# Patient Record
Sex: Female | Born: 1950
Health system: Southern US, Community
[De-identification: ages and names within clinical notes are randomized; demographics above are authoritative.]

## PROBLEM LIST (undated history)

## (undated) DIAGNOSIS — K5790 Diverticulosis of intestine, part unspecified, without perforation or abscess without bleeding: Secondary | ICD-10-CM

## (undated) DIAGNOSIS — E785 Hyperlipidemia, unspecified: Secondary | ICD-10-CM

## (undated) DIAGNOSIS — M8580 Other specified disorders of bone density and structure, unspecified site: Secondary | ICD-10-CM

## (undated) DIAGNOSIS — K209 Esophagitis, unspecified without bleeding: Secondary | ICD-10-CM

## (undated) HISTORY — PX: BREAST SURGERY: SHX581

## (undated) HISTORY — PX: HAMMER TOE SURGERY: SHX385

## (undated) HISTORY — DX: Diverticulosis of intestine, part unspecified, without perforation or abscess without bleeding: K57.90

## (undated) HISTORY — DX: Other specified disorders of bone density and structure, unspecified site: M85.80

## (undated) HISTORY — PX: TENNIS ELBOW RELEASE/NIRSCHEL PROCEDURE: SHX6651

## (undated) HISTORY — PX: OTHER SURGICAL HISTORY: SHX169

## (undated) HISTORY — PX: ABDOMINAL HYSTERECTOMY: SHX81

## (undated) HISTORY — DX: Esophagitis, unspecified without bleeding: K20.90

## (undated) HISTORY — DX: Hyperlipidemia, unspecified: E78.5

## (undated) HISTORY — PX: BUNIONECTOMY: SHX129

## (undated) HISTORY — PX: FOOT SURGERY: SHX648

---

## 1994-10-16 HISTORY — PX: BREAST BIOPSY: SHX20

## 1998-03-08 ENCOUNTER — Encounter: Admission: RE | Admit: 1998-03-08 | Discharge: 1998-03-08 | Payer: Self-pay | Admitting: Family Medicine

## 1998-09-22 ENCOUNTER — Encounter: Admission: RE | Admit: 1998-09-22 | Discharge: 1998-09-22 | Payer: Self-pay | Admitting: Family Medicine

## 1998-10-13 ENCOUNTER — Encounter: Admission: RE | Admit: 1998-10-13 | Discharge: 1998-10-13 | Payer: Self-pay | Admitting: Sports Medicine

## 2000-04-12 ENCOUNTER — Ambulatory Visit (HOSPITAL_BASED_OUTPATIENT_CLINIC_OR_DEPARTMENT_OTHER): Admission: RE | Admit: 2000-04-12 | Discharge: 2000-04-12 | Payer: Self-pay | Admitting: Podiatry

## 2001-09-16 ENCOUNTER — Other Ambulatory Visit: Admission: RE | Admit: 2001-09-16 | Discharge: 2001-09-16 | Payer: Self-pay | Admitting: *Deleted

## 2002-06-06 ENCOUNTER — Encounter: Payer: Self-pay | Admitting: Internal Medicine

## 2002-06-06 ENCOUNTER — Encounter (INDEPENDENT_AMBULATORY_CARE_PROVIDER_SITE_OTHER): Payer: Self-pay | Admitting: Interventional Cardiology

## 2002-06-06 ENCOUNTER — Inpatient Hospital Stay (HOSPITAL_COMMUNITY): Admission: AD | Admit: 2002-06-06 | Discharge: 2002-06-07 | Payer: Self-pay | Admitting: Internal Medicine

## 2002-10-20 ENCOUNTER — Other Ambulatory Visit: Admission: RE | Admit: 2002-10-20 | Discharge: 2002-10-20 | Payer: Self-pay | Admitting: *Deleted

## 2003-12-07 ENCOUNTER — Other Ambulatory Visit: Admission: RE | Admit: 2003-12-07 | Discharge: 2003-12-07 | Payer: Self-pay | Admitting: *Deleted

## 2004-12-19 ENCOUNTER — Other Ambulatory Visit: Admission: RE | Admit: 2004-12-19 | Discharge: 2004-12-19 | Payer: Self-pay | Admitting: *Deleted

## 2005-06-06 ENCOUNTER — Encounter (INDEPENDENT_AMBULATORY_CARE_PROVIDER_SITE_OTHER): Payer: Self-pay | Admitting: *Deleted

## 2005-06-06 ENCOUNTER — Ambulatory Visit (HOSPITAL_COMMUNITY): Admission: RE | Admit: 2005-06-06 | Discharge: 2005-06-06 | Payer: Self-pay | Admitting: Podiatry

## 2005-06-06 ENCOUNTER — Ambulatory Visit (HOSPITAL_BASED_OUTPATIENT_CLINIC_OR_DEPARTMENT_OTHER): Admission: RE | Admit: 2005-06-06 | Discharge: 2005-06-06 | Payer: Self-pay | Admitting: Podiatry

## 2005-08-31 ENCOUNTER — Ambulatory Visit (HOSPITAL_COMMUNITY): Admission: RE | Admit: 2005-08-31 | Discharge: 2005-08-31 | Payer: Self-pay | Admitting: Gastroenterology

## 2006-01-09 ENCOUNTER — Other Ambulatory Visit: Admission: RE | Admit: 2006-01-09 | Discharge: 2006-01-09 | Payer: Self-pay | Admitting: *Deleted

## 2007-02-05 ENCOUNTER — Other Ambulatory Visit: Admission: RE | Admit: 2007-02-05 | Discharge: 2007-02-05 | Payer: Self-pay | Admitting: *Deleted

## 2007-10-25 ENCOUNTER — Ambulatory Visit (HOSPITAL_BASED_OUTPATIENT_CLINIC_OR_DEPARTMENT_OTHER): Admission: RE | Admit: 2007-10-25 | Discharge: 2007-10-25 | Payer: Self-pay | Admitting: Orthopedic Surgery

## 2007-12-31 ENCOUNTER — Encounter: Admission: RE | Admit: 2007-12-31 | Discharge: 2008-02-21 | Payer: Self-pay | Admitting: Orthopedic Surgery

## 2009-08-27 ENCOUNTER — Encounter (INDEPENDENT_AMBULATORY_CARE_PROVIDER_SITE_OTHER): Payer: Self-pay | Admitting: General Surgery

## 2009-08-27 ENCOUNTER — Ambulatory Visit (HOSPITAL_BASED_OUTPATIENT_CLINIC_OR_DEPARTMENT_OTHER): Admission: RE | Admit: 2009-08-27 | Discharge: 2009-08-27 | Payer: Self-pay | Admitting: General Surgery

## 2011-01-18 LAB — POCT HEMOGLOBIN-HEMACUE: Hemoglobin: 13.7 g/dL (ref 12.0–15.0)

## 2011-02-28 NOTE — Op Note (Signed)
[name], [name]             ACCOUNT NO.:  [id number]   MEDICAL RECORD NO.:  [id number]          PATIENT TYPE:  AMB   LOCATION:  DSC                          FACILITY:  [facility]   PHYSICIAN:  John L. Rendall, M.D.  DATE OF BIRTH:  20-Apr-1951   DATE OF PROCEDURE:  10/25/2007  DATE OF DISCHARGE:                               OPERATIVE REPORT   PREOPERATIVE DIAGNOSIS:  Painful metacarpophalangeal prosthesis left  large toe.   SURGICAL PROCEDURES:  Removal of titanium implants large toe proximal  phalanx, debridement of bone spurs of an essential hallux rigidus and  proceed with a Keller bunionectomy.   POSTOPERATIVE DIAGNOSIS:  Failed metacarpophalangeal arthroplasty.   SURGEON:  John L. Rendall, M.D.   ASSISTANT:  Legrand Pitts. Duffy, P.A.   PROCEDURE:  Under general anesthesia, the left foot prepared with  DuraPrep and this was under heavy sedation and ankle block anesthesia.  The left foot and ankle was prepared with DuraPrep and draped as a  sterile field.  The previous two-inch incision medial to the EHL is  reopened, dense scar tissue is encountered and dissection is done with  loupe magnification looking for and trying to be careful of preserving  any cutaneous nerves in the area, none were seen or encountered.  Dissection is carried down to bone across the MP joint.  The titanium  implant was encountered.  By subperiosteal dissection, the distal 2 cm  of the metatarsal head and proximal 1 cm of the proximal phalanx is  exposed.  The joint is dislocated with the distal coming dorsally and  the implant is dislodged.  It was found to not be ingrown in any way and  came out relatively easily.  The proximal end of the phalanx was then  further debrided the distal portion of the toe is then dislocated to the  plantar surface.  The metatarsal head was debrided.  It had extensive  fibrous tissue and a bony osteophyte on the plantar surface.  This was  taken off with osteotome and  rongeur and once it was gone, two 0.62 K-  wires were placed out the end of the toe through the proximal end of the  proximal phalanx.  They are then passed retrograde up the metatarsal.  C-  arm pictures showed good position and alignment of the two pins, the  sharp ends of the pins protruding were then bent to 90 degrees and the  points taken off.  The tourniquet was let down at approximately 35  minutes and no significant bleeding was encountered but the distal toe  pinked up nicely.  The periosteal sleeve was then closed over the bone  at the metatarsal head and at the distal phalanx.  Skin was closed with  a 4-0 nylon baseball stitch.  A sterile dressing and plenty of padding  were applied and the patient is given a wooden shoe in recovery.  She is  to return to the office for a checkup this coming Tuesday.      John L. Rendall, M.D.  Electronically Signed     JLR/MEDQ  D:  10/25/2007  T:  10/25/2007  Job:  [id number]

## 2011-03-03 NOTE — Consult Note (Signed)
[name], [name] NO.:  [id number]   MEDICAL RECORD NO.:  [id number]                   PATIENT TYPE:  INP   LOCATION:  2017                                 FACILITY:  [facility]   PHYSICIAN:  [name], M.D.            DATE OF BIRTH:  Aug 29, 1951   DATE OF CONSULTATION:  06/06/2002  DATE OF DISCHARGE:                              CARDIOLOGY CONSULTATION   INDICATION:  1. Tachy palpitation with associated chest discomfort, two episodes lasting     less than 60 seconds.  Rule out paroxysmal atrial tachycardia, rule out     atrial fibrillation/atrial flutter.  Rule out other.     a. Accelerated junctional rhythm noted on electrocardiogram after        resolution of severe symptoms.  Question transitional rhythm.  2. Hypercholesterolemia.  3. Prior history of smoking.   RECOMMENDATIONS:  1. Monitor x24-36 hours.  2. Check TSH.  3. Event monitor as outpatient if no dysrhythmia documented while     hospitalized.  4. Serial CK and troponin enzymes to rule out myocardial injury.  5. A 2-D echocardiogram.   COMMENTS:  The patient is 60 years of age.  She works in Engineer, agricultural at  [employer].  She has no known history of heart disease.  For  greater than four years, she has suffered very brief episodes of  palpitations.  This morning, once while in the shower and another after  arriving at work while sitting at her desk, she experienced rapid heart  beating, chest tightness, left arm numbness, shortness of breath, and  weakness.  Each episode lasted less than 60 seconds.  This caused her to go  see Dr. Manus Ray in his office.  She stated she was still not feeling exactly  back to normal when she saw Dr. Manus Ray.  An EKG was done and it revealed an  accelerated junctional rhythm, elevated at 94 beats per minute.  No acute ST-  T wave changes.  Clear retrograde P waves were noted.  She now feels fine.  Later on after being admitted,  while an IV was being started, she had a  vasovagal reaction but no tachyarrhythmia.   MEDICATIONS:  Occasional Alka-Seltzer and multivitamin.   PAST SURGICAL HISTORY:  1. Total abdominal hysterectomy.  2. History of elbow injection.  3. History of bilateral breast biopsies.   FAMILY HISTORY:  Father had myocardial infarction age 35.  Mother is in good  health, other than high blood pressure.   HABITS:  Discontinued smoking four years ago.  Denies ethanol consumption.   ALLERGIES:  None.   REVIEW OF SYSTEMS:  Other than occasional palpitations, no complaints.  She  exercises vigorously and has been doing so since January.  This has never  precipitated chest discomfort or palpitations.   PHYSICAL EXAMINATION:  GENERAL:  The patient is in no acute distress.  VITAL SIGNS:  Blood pressure 130/90, heart rate 70.  NECK:  No JVD, thyromegaly, or carotid bruit.  Carotid upstrokes are 2+.  LUNGS:  Clear to auscultation and percussion.  CARDIAC:  Normal.  No clicks, no gallops, no rubs.  ABDOMEN:  Soft.  The liver and spleen are not palpable.  Bowel sounds are  normal.  EXTREMITIES:  No edema.  NEUROLOGIC:  Normal.   LABORATORY DATA:  EKG reveals sinus bradycardia at a rate of 67 beats per  minute having been done at 1:32.  Her echocardiogram, while it was being  done, demonstrated normal LV size and function.   All initial laboratory data including CK-MB and troponin-I are normal.   DISCUSSION:  It appears most likely to me at this time that this  presentation represents symptoms due to a tachyarrhythmia, most likely  supraventricular.  The junctional rhythm noted on EKG in Dr. Randel Books  office likely represents a transitional rhythm, although I cannot totally  exclude the possibility that this was the index rhythm disturbance.  We  would recommend serial enzymes, rule out myocardial infarction since the  patient is hypercholesterolemic.  Monitor for 24-36 hours.  If we cannot   capture an index tachyarrhythmia, would suggest outpatient event monitoring.  We will check a TSH to rule out metabolic causes.                                                [name], M.D.    HWS/MEDQ  D:  06/06/2002  T:  06/09/2002  Job:  [id number]   cc:   [name]. [name], M.D.  [address] [place]  [place] [address]  Fax: [phone]

## 2011-05-24 ENCOUNTER — Encounter (HOSPITAL_BASED_OUTPATIENT_CLINIC_OR_DEPARTMENT_OTHER)
Admission: RE | Admit: 2011-05-24 | Discharge: 2011-05-24 | Disposition: A | Discharge status: Home / Self Care | Payer: 59 | Source: Ambulatory Visit | Attending: Orthopedic Surgery | Admitting: Orthopedic Surgery

## 2011-05-26 ENCOUNTER — Ambulatory Visit (HOSPITAL_BASED_OUTPATIENT_CLINIC_OR_DEPARTMENT_OTHER)
Admission: RE | Admit: 2011-05-26 | Discharge: 2011-05-26 | Disposition: A | Discharge status: Home / Self Care | Payer: 59 | Source: Ambulatory Visit | Attending: Orthopedic Surgery | Admitting: Orthopedic Surgery

## 2011-05-26 DIAGNOSIS — Z01810 Encounter for preprocedural cardiovascular examination: Secondary | ICD-10-CM | POA: Insufficient documentation

## 2011-05-26 DIAGNOSIS — K219 Gastro-esophageal reflux disease without esophagitis: Secondary | ICD-10-CM | POA: Insufficient documentation

## 2011-05-26 DIAGNOSIS — Z01812 Encounter for preprocedural laboratory examination: Secondary | ICD-10-CM | POA: Insufficient documentation

## 2011-05-26 DIAGNOSIS — M2010 Hallux valgus (acquired), unspecified foot: Secondary | ICD-10-CM | POA: Insufficient documentation

## 2011-05-26 DIAGNOSIS — Q66219 Congenital metatarsus primus varus, unspecified foot: Secondary | ICD-10-CM | POA: Insufficient documentation

## 2011-05-26 LAB — POCT HEMOGLOBIN-HEMACUE: Hemoglobin: 13 g/dL (ref 12.0–15.0)

## 2011-05-29 NOTE — Op Note (Signed)
NAMETAYELOR, OSBORNE NO.:  [id number]  MEDICAL RECORD NO.:  [id number]  LOCATION:                                 FACILITY:  PHYSICIAN:  Toni Arthurs, MD        DATE OF BIRTH:  10-27-1950  DATE OF PROCEDURE:  04/25/2011 DATE OF DISCHARGE:                              OPERATIVE REPORT   PREOPERATIVE DIAGNOSIS:  Right hallux valgus and right metatarsus primus varus.  POSTOPERATIVE DIAGNOSIS:  Right hallux valgus and right metatarsus primus varus, right hallux valgus interphalangeus.  PROCEDURE: 1. Right first metatarsal scarf osteotomy. 2. Right McBride bunionectomy. 3. Right hallux Akin osteotomy. 4. Intraoperative interpretation of fluoroscopic imaging.  SURGEON:  Toni Arthurs, MD.  ANESTHESIA:  General, regional.  IV FLUIDS:  See anesthesia record.  ESTIMATED BLOOD LOSS:  Minimal.  TOURNIQUET TIME:  See anesthesia record.  COMPLICATIONS:  None apparent.  DISPOSITION:  Extubated, awake, and stable to recovery.  INDICATIONS FOR PROCEDURE:  The patient is a 60 year old female with a painful bunion.  This has been present for many years.  She has failed treatment with activity and shoe wear modification as well as oral anti- inflammatory medications.  She desires correction of this bunion.  She understands the risks and benefits of the procedure as well as the alternative treatment options.  Specifically, she understands risks of bleeding, infection, nerve damage, blood clots, need for additional surgery, amputation, and death.  She would like to proceed.  PROCEDURE IN DETAIL:  After preoperative consent was obtained and the correct operative site was identified, the patient was brought to the operating room and placed supine on the operating table.  General anesthesia was induced.  Preoperative antibiotics were administered. Surgical time-out was taken.  The right lower extremity was prepped and draped in standard sterile fashion.  A longitudinal  incision was marked on the dorsum of foot over the first webspace.  A second longitudinal incision was marked over the medial eminence.  The foot was exsanguinated with a 4-inch Esmarch bandage and this was wrapped around the ankle as tourniquet.  The medial longitudinal incision was made. Sharp dissection was carried down through the skin and subcutaneous tissue to the level of the medial eminence.  The medial joint capsule was incised in line with its fibers and elevated subperiosteally dorsally and plantarly to expose the metatarsal head.  At this point, attention was turned to the dorsum of the foot.  The dorsal incision was made and sharp dissection was carried down through the subcutaneous tissue and the first webspace.  The lateral sesamoid was identified.  The joint capsule was incised just superficial from the sesamoid and released distally and proximally to the base of the proximal phalanx and proximally to proximal from the sesamoid.  Pack resting was performed at the lateral collateral ligament.  The hallux could then be positioned in 20 degrees of varus passively.  Attention was then returned to the medial incision.  The medial eminence was resected with a sagittal saw medial to the sagittal sulcus.  A 0.054 K-wire was then inserted transversely across the metaphyseal area of the distal first metatarsal.  A second pin was placed across the  metaphyseal area at the proximal end of the metatarsal.  Both were placed perpendicular to the shaft.  AP x-ray showed appropriate position of the guide pins.  A sagittal saw was then used to create a Z-shaped osteotomy with the distal limb dorsal and the proximal limb plantar.  Small wafer of bone was removed from the plantar into the osteotomy allowing free movement of the osteotomy.  The patient was noted to have an increased DMAA.  As a result, a small wedge of bone was removed distally from the osteotomy, allowing correction of the  DMAA.  The metatarsal head was then translated laterally to correct the inner metatarsal angle.  It was rotated as well to decrease the DMAA.  The osteotomy was clamped.  AP simulated weightbearing x-rays were obtained showing appropriate translation of the osteotomy with appropriate coverage of the sesamoids. A 1.5-mm drill bit was then used to drill obliquely from the dorsal metaphyseal bone of the first metatarsal into the metatarsal head.  This was countersunk and a 2-mm fully-threaded screw was inserted and noted to have appropriate purchase.  Proximally, the metatarsal shaft was drilled and filled with bicortical screw.  The third screw was added in the central portion of the osteotomy again in bicortical fashion because of the frist screw did not have excellent purchase.  AP and lateral views showed appropriate position and length of all hardware and appropriate reduction of the inner metatarsal and DMA angles.  Again, a simulated weightbearing view was obtained.  This views showed hallux valgus interphalangeus.  The decision was made to proceed with an Akin osteotomy to obtain better correction.  The distal extent of the wound was extended and subperiosteal dissection was carried plantarly and dorsally under the EHL and FHL tendons.  The tendons were protected with retractors.  A K-wire was inserted perpendicular to the base of the proximal phalanx.  This position was verified on fluoroscopy.  A transverse osteotomy was then made without perforating the far cortex.  A second osteotomy was made removing a wedge of bone from the medial aspect of the proximal phalanx.  This was removed with a small curette.  The osteotomy was closed down and held into position.  A 1.5-mm hole was drilled across the osteotomy site.  A 2-mm screw was inserted and noted to have appropriate purchase compressing the osteotomy site medially.  A simulated AP weightbearing x- ray was then obtained showing  appropriate reduction of the hallux interphalangeus angle.  The wound was irrigated copiously.  The overhanging bone at the metatarsal osteotomy was trimmed with a sagittal saw.  The medial joint capsule was then repaired in imbricating fashion with 0 Vicryl sutures.  The subcutaneous tissue was closed with inverted simple sutures of 4-0 Monocryl.  The skin was closed with running 3-0 Prolene stitch.  The dorsal incision was closed with horizontal mattress sutures of 3-0 Prolene.  Sterile dressings were applied followed by a bunion wrap.  The tourniquet had been released at just over an hour. The patient was awakened by anesthesia and transported to the recovery room in stable condition.  FOLLOWUP PLAN:  The patient will be weightbearing as tolerated on her right lower extremity in a Darco wedge style shoe.  She will follow up with me in 2 weeks for suture removal.     Toni Arthurs, MD     JH/MEDQ  D:  05/26/2011  T:  05/26/2011  Job:  [id number]  Electronically Signed by Jonny Ruiz Findley Blankenbaker  on 05/29/2011 09:18:01  AM

## 2011-10-31 ENCOUNTER — Other Ambulatory Visit (HOSPITAL_COMMUNITY): Payer: Self-pay | Admitting: Gastroenterology

## 2011-10-31 ENCOUNTER — Ambulatory Visit (HOSPITAL_COMMUNITY)
Admission: RE | Admit: 2011-10-31 | Discharge: 2011-10-31 | Disposition: A | Discharge status: Home / Self Care | Payer: 59 | Source: Ambulatory Visit | Attending: Gastroenterology | Admitting: Gastroenterology

## 2011-10-31 DIAGNOSIS — K625 Hemorrhage of anus and rectum: Secondary | ICD-10-CM | POA: Insufficient documentation

## 2011-10-31 DIAGNOSIS — R1032 Left lower quadrant pain: Secondary | ICD-10-CM

## 2011-10-31 DIAGNOSIS — I700 Atherosclerosis of aorta: Secondary | ICD-10-CM | POA: Insufficient documentation

## 2011-10-31 MED ORDER — IOHEXOL 300 MG/ML  SOLN
80.0000 mL | Freq: Once | INTRAMUSCULAR | Status: AC | PRN
  Administered 2011-10-31: 80 mL via INTRAVENOUS

## 2011-10-31 MED ORDER — IOHEXOL 300 MG/ML  SOLN: 20.0000 mL | INTRAMUSCULAR | Status: AC

## 2012-11-20 ENCOUNTER — Ambulatory Visit
Admission: RE | Admit: 2012-11-20 | Discharge: 2012-11-20 | Disposition: A | Discharge status: Home / Self Care | Payer: BC Managed Care – PPO | Source: Ambulatory Visit | Attending: Family Medicine | Admitting: Family Medicine

## 2012-11-20 ENCOUNTER — Other Ambulatory Visit: Payer: Self-pay | Admitting: Family Medicine

## 2012-11-20 DIAGNOSIS — M79645 Pain in left finger(s): Secondary | ICD-10-CM

## 2012-11-20 DIAGNOSIS — M25532 Pain in left wrist: Secondary | ICD-10-CM

## 2014-11-18 ENCOUNTER — Encounter: Payer: Self-pay | Admitting: Family Medicine

## 2014-11-18 DIAGNOSIS — E785 Hyperlipidemia, unspecified: Secondary | ICD-10-CM | POA: Insufficient documentation

## 2014-12-15 ENCOUNTER — Encounter: Payer: Self-pay | Admitting: Family Medicine

## 2014-12-15 ENCOUNTER — Ambulatory Visit (INDEPENDENT_AMBULATORY_CARE_PROVIDER_SITE_OTHER): Payer: PPO | Admitting: Family Medicine

## 2014-12-15 VITALS — BP 132/86 | HR 64 | Temp 98.1°F | Resp 16 | Ht 63.0 in | Wt 155.0 lb

## 2014-12-15 DIAGNOSIS — M8580 Other specified disorders of bone density and structure, unspecified site: Secondary | ICD-10-CM | POA: Insufficient documentation

## 2014-12-15 DIAGNOSIS — Z23 Encounter for immunization: Secondary | ICD-10-CM | POA: Diagnosis not present

## 2014-12-15 DIAGNOSIS — Z7689 Persons encountering health services in other specified circumstances: Secondary | ICD-10-CM

## 2014-12-15 DIAGNOSIS — Z7189 Other specified counseling: Secondary | ICD-10-CM

## 2014-12-15 NOTE — Progress Notes (Signed)
Subjective:    Patient ID: [name], female    DOB: 07/23/1951, 64 y.o.   MRN: [id number]  HPI  patient is a very pleasant 64 year old white female who is here today to establish care. She is due for her physical this summer and would like to defer blood work until this summer. Her last colonoscopy was 10 years ago and she is due now for a repeat colonoscopy. The patient would like to defer that at the present time due to the fact her mother has recently been placed on hospice. Patient is also due for Pneumovax 23 as well as the shingles vaccine. She would like to get Pneumovax 23 today and check on the shingles vaccine. Patient denies any other medical problems. Her Pap smear is up-to-date and performing her gynecologist. Her mammogram is not due until July. She is also due for a bone density which has not been scheduled. Past Medical History  Diagnosis Date  . Hyperlipidemia   . Osteopenia    Past Surgical History  Procedure Laterality Date  . Carpel tunnel    . Abdominal hysterectomy      age 64, NO BSO  . Breast surgery      biopsy x 2, benign   Current Outpatient Prescriptions on File Prior to Visit  Medication Sig Dispense Refill  . acetaminophen (TYLENOL) 500 MG tablet Take 500 mg by mouth every morning.    . meloxicam (MOBIC) 15 MG tablet Take 15 mg by mouth daily.    . Omega-3 Fatty Acids (FISH OIL) 1200 MG CAPS Take 1 capsule by mouth daily.    . simvastatin (ZOCOR) 10 MG tablet Take 10 mg by mouth daily.     No current facility-administered medications on file prior to visit.   Allergies  Allergen Reactions  . Asa [Aspirin] Other (See Comments)    Stomach irritation  . Ibuprofen Other (See Comments)    Stomach irritation   History   Social History  . Marital Status: Married    Spouse Name: N/A  . Number of Children: N/A  . Years of Education: N/A   Occupational History  . Not on file.   Social History Main Topics  . Smoking status: Former Smoker   Types: Cigarettes    Quit date: 12/14/1997  . Smokeless tobacco: Not on file  . Alcohol Use: No  . Drug Use: No  . Sexual Activity: Not Currently   Other Topics Concern  . Not on file   Social History Narrative   Family History  Problem Relation Age of Onset  . Depression Mother   . Cancer Mother     breast s/p mastectomy  . Alzheimer's disease Father   . Early death Maternal Grandfather 36    [place] Accident      Review of Systems  All other systems reviewed and are negative.      Objective:   Physical Exam  Constitutional: She appears well-developed and well-nourished.  HENT:  Right Ear: External ear normal.  Left Ear: External ear normal.  Nose: Nose normal.  Mouth/Throat: Oropharynx is clear and moist. No oropharyngeal exudate.  Eyes: Conjunctivae are normal.  Neck: Neck supple. No JVD present. No thyromegaly present.  Cardiovascular: Normal rate, regular rhythm, normal heart sounds and intact distal pulses.  Exam reveals no gallop and no friction rub.   No murmur heard. Pulmonary/Chest: Effort normal and breath sounds normal. No respiratory distress. She has no wheezes. She has no rales.  Abdominal: Soft. Bowel  sounds are normal. She exhibits no distension. There is no tenderness. There is no rebound and no guarding.  Musculoskeletal: She exhibits no edema.  Lymphadenopathy:    She has no cervical adenopathy.  Vitals reviewed.         Assessment & Plan:  Need for prophylactic vaccination against Streptococcus pneumoniae (pneumococcus) - Plan: Pneumococcal polysaccharide vaccine 23-valent greater than or equal to 2yo subcutaneous/IM  Establishing care with new doctor, encounter for   I would like to see the patient in July for complete physical exam. At that time I will perform a CBC, CMP, fasting lipid panel, and TSH. I have recommended a colonoscopy but I will defer scheduling this until the patient requested due to the situation with her mother. Patent  received Pneumovax 23 today in clinic. I did recommend the shingles vaccine.  I also recommended the patient get a bone density test when she has her mammogram done. I've asked the patient to call me when her mammogram is scheduled so that I can also send an order over for a bone density test

## 2015-04-22 ENCOUNTER — Ambulatory Visit (INDEPENDENT_AMBULATORY_CARE_PROVIDER_SITE_OTHER): Payer: PPO | Admitting: Family Medicine

## 2015-04-22 ENCOUNTER — Encounter: Payer: Self-pay | Admitting: Family Medicine

## 2015-04-22 VITALS — BP 126/70 | HR 68 | Temp 98.2°F | Resp 14 | Ht 63.0 in | Wt 152.0 lb

## 2015-04-22 DIAGNOSIS — H9313 Tinnitus, bilateral: Secondary | ICD-10-CM

## 2015-04-22 DIAGNOSIS — Z0000 Encounter for general adult medical examination without abnormal findings: Secondary | ICD-10-CM | POA: Diagnosis not present

## 2015-04-22 LAB — LIPID PANEL
CHOL/HDL RATIO: 3.2 ratio
Cholesterol: 200 mg/dL (ref 0–200)
HDL: 62 mg/dL (ref >=46)
LDL CALC: 98 mg/dL (ref 0–99)
Triglycerides: 200 mg/dL — ABNORMAL HIGH (ref <150)
VLDL: 40 mg/dL (ref 0–40)

## 2015-04-22 LAB — COMPLETE METABOLIC PANEL WITH GFR
ALK PHOS: 65 U/L (ref 39–117)
ALT: 16 U/L (ref 0–35)
AST: 22 U/L (ref 0–37)
Albumin: 4.6 g/dL (ref 3.5–5.2)
BILIRUBIN TOTAL: 0.8 mg/dL (ref 0.2–1.2)
BUN: 13 mg/dL (ref 6–23)
CO2: 27 mEq/L (ref 19–32)
CREATININE: 0.63 mg/dL (ref 0.50–1.10)
Calcium: 10 mg/dL (ref 8.4–10.5)
Chloride: 99 mEq/L (ref 96–112)
GFR, Est African American: >89 mL/min
GLUCOSE: 90 mg/dL (ref 70–99)
Potassium: 5.3 mEq/L (ref 3.5–5.3)
Sodium: 138 mEq/L (ref 135–145)
Total Protein: 7.6 g/dL (ref 6.0–8.3)

## 2015-04-22 LAB — CBC WITH DIFFERENTIAL/PLATELET
BASOS PCT: 1 % (ref 0–1)
Basophils Absolute: 0.1 10*3/uL (ref 0.0–0.1)
EOS PCT: 1 % (ref 0–5)
Eosinophils Absolute: 0.1 10*3/uL (ref 0.0–0.7)
HEMATOCRIT: 39.9 % (ref 36.0–46.0)
Hemoglobin: 13.4 g/dL (ref 12.0–15.0)
LYMPHS PCT: 37 % (ref 12–46)
Lymphs Abs: 2.2 10*3/uL (ref 0.7–4.0)
MCH: 31.8 pg (ref 26.0–34.0)
MCHC: 33.6 g/dL (ref 30.0–36.0)
MCV: 94.8 fL (ref 78.0–100.0)
MONO ABS: 0.6 10*3/uL (ref 0.1–1.0)
MONOS PCT: 10 % (ref 3–12)
MPV: 10.2 fL (ref 8.6–12.4)
NEUTROS ABS: 3 10*3/uL (ref 1.7–7.7)
Neutrophils Relative %: 51 % (ref 43–77)
PLATELETS: 347 10*3/uL (ref 150–400)
RBC: 4.21 MIL/uL (ref 3.87–5.11)
RDW: 13.2 % (ref 11.5–15.5)
WBC: 5.9 10*3/uL (ref 4.0–10.5)

## 2015-04-22 LAB — TSH: TSH: 3.09 u[IU]/mL (ref 0.350–4.500)

## 2015-04-22 MED ORDER — ZOSTER VACCINE LIVE 19400 UNT/0.65ML ~~LOC~~ SOLR: 0.6500 mL | Freq: Once | SUBCUTANEOUS | Status: DC

## 2015-04-22 NOTE — Progress Notes (Signed)
Subjective:    Patient ID: [name], female    DOB: 08-13-1951, 64 y.o.   MRN: [id number]  HPI Patient is a very pleasant 64 year old white female who is here today for complete physical exam. Unfortunately her mother passed away shortly after I saw her at her last visit. Patient is due for colonoscopy but she would like to defer this until later this fall. She is requesting referral to an ear nose and throat physician due to tinnitus. She has had tinnitus in both ears for several years. She also has some hearing loss. She occasionally experiences vertigo. She would like an evaluation for possible Mnire's disease although her symptoms sound more like standard tinnitus. Otherwise she is doing well. Her mammogram is already been scheduled. She does not require Pap smears because of her history of a hysterectomy. She is due for a bone density test. She is not due for Prevnar 13 until next year. She is due for the shingles vaccine and we will send that to her pharmacy today. Past Medical History  Diagnosis Date  . Hyperlipidemia   . Osteopenia    Past Surgical History  Procedure Laterality Date  . Carpel tunnel    . Abdominal hysterectomy      age 20, NO BSO  . Breast surgery      biopsy x 2, benign   Current Outpatient Prescriptions on File Prior to Visit  Medication Sig Dispense Refill  . acetaminophen (TYLENOL) 500 MG tablet Take 500 mg by mouth every morning.    . meloxicam (MOBIC) 15 MG tablet Take 15 mg by mouth daily.    . Omega-3 Fatty Acids (FISH OIL) 1200 MG CAPS Take 1 capsule by mouth daily.    . simvastatin (ZOCOR) 10 MG tablet Take 10 mg by mouth daily.     No current facility-administered medications on file prior to visit.   Allergies  Allergen Reactions  . Asa [Aspirin] Other (See Comments)    Stomach irritation  . Ibuprofen Other (See Comments)    Stomach irritation   History   Social History  . Marital Status: Married    Spouse Name: N/A  . Number  of Children: N/A  . Years of Education: N/A   Occupational History  . Not on file.   Social History Main Topics  . Smoking status: Former Smoker    Types: Cigarettes    Quit date: 12/14/1997  . Smokeless tobacco: Not on file  . Alcohol Use: No  . Drug Use: No  . Sexual Activity: Not Currently   Other Topics Concern  . Not on file   Social History Narrative   Family History  Problem Relation Age of Onset  . Depression Mother   . Cancer Mother     breast s/p mastectomy  . Alzheimer's disease Father   . Early death Maternal Grandfather 20    [place] Accident     Review of Systems  All other systems reviewed and are negative.      Objective:   Physical Exam  Constitutional: She is oriented to person, place, and time. She appears well-developed and well-nourished. No distress.  HENT:  Head: Normocephalic and atraumatic.  Right Ear: External ear normal.  Left Ear: External ear normal.  Nose: Nose normal.  Mouth/Throat: Oropharynx is clear and moist. No oropharyngeal exudate.  Eyes: Conjunctivae and EOM are normal. Pupils are equal, round, and reactive to light. Right eye exhibits no discharge. Left eye exhibits no discharge. No scleral  icterus.  Neck: Normal range of motion. Neck supple. No JVD present. No tracheal deviation present. No thyromegaly present.  Cardiovascular: Normal rate, regular rhythm, normal heart sounds and intact distal pulses.  Exam reveals no gallop and no friction rub.   No murmur heard. Pulmonary/Chest: Effort normal and breath sounds normal. No stridor. No respiratory distress. She has no wheezes. She has no rales. She exhibits no tenderness.  Abdominal: Soft. Bowel sounds are normal. She exhibits no distension and no mass. There is no tenderness. There is no rebound and no guarding.  Musculoskeletal: Normal range of motion. She exhibits no edema or tenderness.  Lymphadenopathy:    She has no cervical adenopathy.  Neurological: She is alert and  oriented to person, place, and time. She has normal reflexes. She displays normal reflexes. No cranial nerve deficit. She exhibits normal muscle tone. Coordination normal.  Skin: Skin is warm. No rash noted. She is not diaphoretic. No erythema. No pallor.  Psychiatric: She has a normal mood and affect. Her behavior is normal. Judgment and thought content normal.  Vitals reviewed.         Assessment & Plan:  Routine general medical examination at a health care facility - Plan: DG Bone Density, CBC with Differential/Platelet, COMPLETE METABOLIC PANEL WITH GFR, Lipid panel, TSH  Tinnitus aurium, bilateral - Plan: Ambulatory referral to ENT  Physical exam is completely normal except for 2 lesions on her arms. Both her proximally 4 mm there are erythematous scaly papules. They appear to be AK's. Patient would like to monitor these.  If the lesions worsen we can certainly treat him with cryotherapy. I will schedule her for colonoscopy when she request. I have scheduled her for a bone density. Her mammogram is been scheduled. We will check standard fasting lab work. I sent the shingles vaccine to her pharmacy. I will also refer the patient to a near nose throat physician however I suspect that this is standard tinnitus and that no treatment option will be available other than possible hearing aids which the patient does not seem to require the present time

## 2015-04-23 ENCOUNTER — Encounter: Payer: Self-pay | Admitting: *Deleted

## 2015-04-28 ENCOUNTER — Telehealth: Payer: Self-pay | Admitting: *Deleted

## 2015-04-28 NOTE — Telephone Encounter (Signed)
Submitted [facility] referral thru [facility] for authorization to Dr. Seabron Spates Rosen,MD with authorization [id number]  Requesting provider: Flonnie Hailstone  Treating provider: Seabron Spates Rosen,MD  Number of visits:6  Start Date:04/26/15  End Date:10/23/15  Dx:H93.13-Tinnitus,Bilateral  Copy has been faxed to [facility] for records/review

## 2015-05-04 ENCOUNTER — Encounter: Payer: Self-pay | Admitting: Family Medicine

## 2015-05-05 ENCOUNTER — Other Ambulatory Visit: Payer: Self-pay | Admitting: Family Medicine

## 2015-05-05 MED ORDER — SIMVASTATIN 10 MG PO TABS: 10.0000 mg | ORAL_TABLET | Freq: Every day | ORAL | Status: DC

## 2015-05-05 NOTE — Telephone Encounter (Signed)
Medication refilled per protocol. 

## 2015-05-11 LAB — HM DEXA SCAN

## 2015-05-12 LAB — HM MAMMOGRAPHY: HM MAMMO: NORMAL

## 2015-05-24 ENCOUNTER — Encounter: Payer: Self-pay | Admitting: Family Medicine

## 2015-05-24 DIAGNOSIS — M810 Age-related osteoporosis without current pathological fracture: Secondary | ICD-10-CM | POA: Insufficient documentation

## 2015-05-25 ENCOUNTER — Encounter: Payer: Self-pay | Admitting: Family Medicine

## 2015-06-16 ENCOUNTER — Telehealth: Payer: Self-pay | Admitting: Family Medicine

## 2015-06-16 NOTE — Telephone Encounter (Signed)
Dexa scan shows osteoporosis in spine and osteopenia in hip - Will recheck in 2 year and if hip is worse may consider treatment medication at that time. Continue to take calcium and vit D - Pt is aware of results via mailed letter

## 2015-07-01 ENCOUNTER — Ambulatory Visit (INDEPENDENT_AMBULATORY_CARE_PROVIDER_SITE_OTHER): Payer: PPO | Admitting: Family Medicine

## 2015-07-01 ENCOUNTER — Ambulatory Visit
Admission: RE | Admit: 2015-07-01 | Discharge: 2015-07-01 | Disposition: A | Discharge status: Home / Self Care | Payer: PPO | Source: Ambulatory Visit | Attending: Family Medicine | Admitting: Family Medicine

## 2015-07-01 ENCOUNTER — Other Ambulatory Visit: Payer: Self-pay | Admitting: Family Medicine

## 2015-07-01 ENCOUNTER — Encounter: Payer: Self-pay | Admitting: Family Medicine

## 2015-07-01 VITALS — BP 110/70 | HR 64 | Temp 98.3°F | Resp 18 | Wt 155.0 lb

## 2015-07-01 DIAGNOSIS — M79605 Pain in left leg: Secondary | ICD-10-CM

## 2015-07-01 DIAGNOSIS — M792 Neuralgia and neuritis, unspecified: Secondary | ICD-10-CM

## 2015-07-01 DIAGNOSIS — M79602 Pain in left arm: Secondary | ICD-10-CM

## 2015-07-01 DIAGNOSIS — G5792 Unspecified mononeuropathy of left lower limb: Secondary | ICD-10-CM

## 2015-07-01 MED ORDER — DICLOFENAC SODIUM 1 % TD GEL: 2.0000 g | Freq: Four times a day (QID) | TRANSDERMAL | Status: DC

## 2015-07-01 MED ORDER — GABAPENTIN 300 MG PO CAPS: 300.0000 mg | ORAL_CAPSULE | Freq: Three times a day (TID) | ORAL | Status: DC

## 2015-07-01 NOTE — Progress Notes (Signed)
Subjective:    Patient ID: [name], female    DOB: 1951/08/02, 64 y.o.   MRN: [id number]  HPI  Patient has a history of a bunionectomy in her right foot at the first MTP joint. She has lateral deviation of the first toe at the MTP joint. She has constant pain in the MTP joint. She has severe pain which is burning and stinging in nature and also deep and aching at times in the first toe distal to the MTP joint. There are no visible abnormalities other than mild onychomycosis of the toenail of the first toe second, third, and fourth toes. She also complains of pain in her left hip which radiates anteriorly into the left mid thigh. It awakens her from sleep at night. She denies any injuries or falls. She denies any low back pain. She has been walking awkwardly to take pressure off her foot may have aggravated her hip and her thigh muscles. There is no pain in the left knee. Past Medical History  Diagnosis Date  . Hyperlipidemia   . Osteopenia    Past Surgical History  Procedure Laterality Date  . Carpel tunnel    . Abdominal hysterectomy      age 34, NO BSO  . Breast surgery      biopsy x 2, benign   Current Outpatient Prescriptions on File Prior to Visit  Medication Sig Dispense Refill  . acetaminophen (TYLENOL) 500 MG tablet Take 500 mg by mouth every morning.    . meloxicam (MOBIC) 15 MG tablet Take 15 mg by mouth daily.    . Omega-3 Fatty Acids (FISH OIL) 1200 MG CAPS Take 1 capsule by mouth daily.    . simvastatin (ZOCOR) 10 MG tablet Take 1 tablet (10 mg total) by mouth daily. 90 tablet 3   No current facility-administered medications on file prior to visit.   Allergies  Allergen Reactions  . Asa [Aspirin] Other (See Comments)    Stomach irritation  . Ibuprofen Other (See Comments)    Stomach irritation   Social History   Social History  . Marital Status: Married    Spouse Name: N/A  . Number of Children: N/A  . Years of Education: N/A   Occupational History   . Not on file.   Social History Main Topics  . Smoking status: Former Smoker    Types: Cigarettes    Quit date: 12/14/1997  . Smokeless tobacco: Not on file  . Alcohol Use: No  . Drug Use: No  . Sexual Activity: Not Currently   Other Topics Concern  . Not on file   Social History Narrative     Review of Systems  All other systems reviewed and are negative.      Objective:   Physical Exam  Cardiovascular: Normal rate, regular rhythm, normal heart sounds and intact distal pulses.   Pulmonary/Chest: Effort normal and breath sounds normal.  Musculoskeletal:       Left hip: She exhibits decreased range of motion and tenderness.       Right ankle: She exhibits normal range of motion, no swelling, no ecchymosis, no deformity, no laceration and normal pulse.       Left upper leg: She exhibits tenderness and bony tenderness.       Right foot: There is tenderness, bony tenderness and deformity.  Skin: Rash noted.  Vitals reviewed.         Assessment & Plan:  Neuropathic pain of foot, left - Plan: gabapentin (  NEURONTIN) 300 MG capsule, diclofenac sodium (VOLTAREN) 1 % GEL  Leg pain, diffuse, left - Plan: DG HIP UNILAT WITH PELVIS 2-3 VIEWS LEFT, CANCELED: DG FEMUR 1V LEFT  I believe the pain in the left first toe distal to the MTP joint is due to a combination of nerve damage from the previous surgery possibly as well as some arthritic changes due to the deformity at the MTP joint. I will treat this with a combination of gabapentin 300 mg by mouth 3 times a day as well as Voltaren gel 4 times a day. Recheck in 2 weeks. I believe the pain in the patient's left hip and her left thigh are likely due to compensation while walking to take pressure off her right foot. It is also possible that this could be radicular pain from her lower back. I will obtain x-rays of her left hip as well as her left femur to rule out skeletal abnormalities. Hopefully rest and the gabapentin will help with  this pain as well. Recheck in 2 weeks

## 2015-07-15 ENCOUNTER — Ambulatory Visit (INDEPENDENT_AMBULATORY_CARE_PROVIDER_SITE_OTHER): Payer: PPO | Admitting: Family Medicine

## 2015-07-15 ENCOUNTER — Encounter: Payer: Self-pay | Admitting: Family Medicine

## 2015-07-15 VITALS — BP 130/82 | HR 62 | Temp 98.4°F | Resp 16 | Ht 63.0 in | Wt 155.0 lb

## 2015-07-15 DIAGNOSIS — Z23 Encounter for immunization: Secondary | ICD-10-CM | POA: Diagnosis not present

## 2015-07-15 DIAGNOSIS — M79605 Pain in left leg: Secondary | ICD-10-CM

## 2015-07-15 DIAGNOSIS — G5792 Unspecified mononeuropathy of left lower limb: Secondary | ICD-10-CM

## 2015-07-15 DIAGNOSIS — M79602 Pain in left arm: Secondary | ICD-10-CM | POA: Diagnosis not present

## 2015-07-15 DIAGNOSIS — M792 Neuralgia and neuritis, unspecified: Secondary | ICD-10-CM

## 2015-07-15 NOTE — Progress Notes (Signed)
Subjective:    Patient ID: [name], female    DOB: Dec 16, 1950, 64 y.o.   MRN: [id number]  HPI  07/01/15 Patient has a history of a bunionectomy in her right foot at the first MTP joint. She has lateral deviation of the first toe at the MTP joint. She has constant pain in the MTP joint. She has severe pain which is burning and stinging in nature and also deep and aching at times in the first toe distal to the MTP joint. There are no visible abnormalities other than mild onychomycosis of the toenail of the first toe second, third, and fourth toes. She also complains of pain in her left hip which radiates anteriorly into the left mid thigh. It awakens her from sleep at night. She denies any injuries or falls. She denies any low back pain. She has been walking awkwardly to take pressure off her foot may have aggravated her hip and her thigh muscles. There is no pain in the left knee.  At that time, my plan was: I believe the pain in the left first toe distal to the MTP joint is due to a combination of nerve damage from the previous surgery possibly as well as some arthritic changes due to the deformity at the MTP joint. I will treat this with a combination of gabapentin 300 mg by mouth 3 times a day as well as Voltaren gel 4 times a day. Recheck in 2 weeks. I believe the pain in the patient's left hip and her left thigh are likely due to compensation while walking to take pressure off her right foot. It is also possible that this could be radicular pain from her lower back. I will obtain x-rays of her left hip as well as her left femur to rule out skeletal abnormalities. Hopefully rest and the gabapentin will help with this pain as well. Recheck in 2 weeks  07/15/15 Pain in the patient's right first MTP joint has improved 25-30%. Patient is much happier with this. She would like to continue the Voltaren gel at the present time. She is also notices a significant benefit with the pain in her left leg  with the gabapentin. X-rays of the femur and the pelvis in that area were completely normal. I am now suspicious that the patient may have neuropathic pain in her left leg secondary to lumbar radiculopathy. Past Medical History  Diagnosis Date  . Hyperlipidemia   . Osteopenia    Past Surgical History  Procedure Laterality Date  . Carpel tunnel    . Abdominal hysterectomy      age 68, NO BSO  . Breast surgery      biopsy x 2, benign   Current Outpatient Prescriptions on File Prior to Visit  Medication Sig Dispense Refill  . acetaminophen (TYLENOL) 500 MG tablet Take 500 mg by mouth every morning.    . Calcium Carbonate-Vit D-Min (CALCIUM 1200 PO) Take 1,200 mg by mouth.    . cholecalciferol (VITAMIN D) 1000 UNITS tablet Take 1,000 Units by mouth daily.    . diclofenac sodium (VOLTAREN) 1 % GEL Apply 2 g topically 4 (four) times daily. 100 g 1  . gabapentin (NEURONTIN) 300 MG capsule Take 1 capsule (300 mg total) by mouth 3 (three) times daily. 90 capsule 3  . meloxicam (MOBIC) 15 MG tablet Take 15 mg by mouth daily.    . Omega-3 Fatty Acids (FISH OIL) 1200 MG CAPS Take 1 capsule by mouth daily.    Marland Kitchen  simvastatin (ZOCOR) 10 MG tablet Take 1 tablet (10 mg total) by mouth daily. 90 tablet 3   No current facility-administered medications on file prior to visit.   Allergies  Allergen Reactions  . Asa [Aspirin] Other (See Comments)    Stomach irritation  . Ibuprofen Other (See Comments)    Stomach irritation   Social History   Social History  . Marital Status: Married    Spouse Name: N/A  . Number of Children: N/A  . Years of Education: N/A   Occupational History  . Not on file.   Social History Main Topics  . Smoking status: Former Smoker    Types: Cigarettes    Quit date: 12/14/1997  . Smokeless tobacco: Not on file  . Alcohol Use: No  . Drug Use: No  . Sexual Activity: Not Currently   Other Topics Concern  . Not on file   Social History Narrative     Review of  Systems  All other systems reviewed and are negative.      Objective:   Physical Exam  Cardiovascular: Normal rate, regular rhythm, normal heart sounds and intact distal pulses.   Pulmonary/Chest: Effort normal and breath sounds normal.  Musculoskeletal:       Left hip: She exhibits decreased range of motion and tenderness.       Right ankle: She exhibits normal range of motion, no swelling, no ecchymosis, no deformity, no laceration and normal pulse.       Left upper leg: She exhibits tenderness and bony tenderness.       Right foot: There is tenderness, bony tenderness and deformity.  Skin: Rash noted.  Vitals reviewed.         Assessment & Plan:  Neuropathic pain of foot, left  Leg pain, diffuse, left  Continue voltaren gel on the right first MTP joint. Increase gabapentin to 600 mg by mouth 3 times a day for the neuropathic pain in her left leg and also for the neuropathic pain in her right foot. If pain worsens in the right foot, next option would be a podiatry referral versus trying a compounding pharmacy to compound gabapentin, voltaren, and lidocaine into a gel which could be applied to the right first MTP joint. Patient can also try capsaicin cream over-the-counter

## 2015-07-15 NOTE — Addendum Note (Signed)
Addended by: Shary Decamp B on: 07/15/2015 12:41 PM   Modules accepted: Orders

## 2015-08-02 ENCOUNTER — Other Ambulatory Visit: Payer: Self-pay | Admitting: Family Medicine

## 2015-08-02 MED ORDER — MELOXICAM 15 MG PO TABS: 15.0000 mg | ORAL_TABLET | Freq: Every day | ORAL | Status: DC

## 2015-08-23 ENCOUNTER — Ambulatory Visit: Payer: PPO | Admitting: Physician Assistant

## 2015-11-01 MED FILL — SIMVASTATIN 10 MG TABLET: 10 | 90 days supply | Qty: 90 | Fill #2

## 2015-11-01 MED FILL — MELOXICAM 15 MG TABLET: 15 | 90 days supply | Qty: 90 | Fill #1

## 2015-11-04 MED FILL — GABAPENTIN 300 MG CAPSULE: 300 | 30 days supply | Qty: 90 | Fill #3

## 2015-12-21 ENCOUNTER — Other Ambulatory Visit: Payer: Self-pay | Admitting: Family Medicine

## 2015-12-21 MED ORDER — GABAPENTIN 300 MG PO CAPS: ORAL_CAPSULE | ORAL | Status: DC

## 2015-12-21 MED FILL — GABAPENTIN 300 MG CAPSULE: 300 | 30 days supply | Qty: 90 | Fill #0

## 2015-12-21 NOTE — Telephone Encounter (Signed)
Refill appropriate and filled per protocol. 

## 2015-12-22 DIAGNOSIS — L570 Actinic keratosis: Secondary | ICD-10-CM | POA: Diagnosis not present

## 2015-12-22 DIAGNOSIS — L298 Other pruritus: Secondary | ICD-10-CM | POA: Diagnosis not present

## 2015-12-22 DIAGNOSIS — L814 Other melanin hyperpigmentation: Secondary | ICD-10-CM | POA: Diagnosis not present

## 2016-01-26 MED FILL — SIMVASTATIN 10 MG TABLET: 10 | 90 days supply | Qty: 90 | Fill #3

## 2016-02-02 ENCOUNTER — Emergency Department (HOSPITAL_COMMUNITY)
Admission: EM | Admit: 2016-02-02 | Discharge: 2016-02-02 | Disposition: A | Discharge status: Home / Self Care | Payer: PPO | Attending: Emergency Medicine | Admitting: Emergency Medicine

## 2016-02-02 ENCOUNTER — Encounter (HOSPITAL_COMMUNITY): Payer: Self-pay | Admitting: Emergency Medicine

## 2016-02-02 ENCOUNTER — Encounter: Payer: Self-pay | Admitting: Physician Assistant

## 2016-02-02 ENCOUNTER — Ambulatory Visit (INDEPENDENT_AMBULATORY_CARE_PROVIDER_SITE_OTHER): Payer: PPO | Admitting: Physician Assistant

## 2016-02-02 VITALS — HR 72 | Temp 99.7°F | Resp 18 | Wt 155.0 lb

## 2016-02-02 DIAGNOSIS — R42 Dizziness and giddiness: Secondary | ICD-10-CM | POA: Diagnosis not present

## 2016-02-02 DIAGNOSIS — R55 Syncope and collapse: Secondary | ICD-10-CM | POA: Diagnosis not present

## 2016-02-02 DIAGNOSIS — Z79899 Other long term (current) drug therapy: Secondary | ICD-10-CM | POA: Diagnosis not present

## 2016-02-02 DIAGNOSIS — R509 Fever, unspecified: Secondary | ICD-10-CM

## 2016-02-02 DIAGNOSIS — J029 Acute pharyngitis, unspecified: Secondary | ICD-10-CM

## 2016-02-02 DIAGNOSIS — Z87891 Personal history of nicotine dependence: Secondary | ICD-10-CM | POA: Insufficient documentation

## 2016-02-02 DIAGNOSIS — M8580 Other specified disorders of bone density and structure, unspecified site: Secondary | ICD-10-CM | POA: Diagnosis not present

## 2016-02-02 DIAGNOSIS — Z791 Long term (current) use of non-steroidal anti-inflammatories (NSAID): Secondary | ICD-10-CM | POA: Diagnosis not present

## 2016-02-02 DIAGNOSIS — J020 Streptococcal pharyngitis: Secondary | ICD-10-CM | POA: Diagnosis not present

## 2016-02-02 DIAGNOSIS — E785 Hyperlipidemia, unspecified: Secondary | ICD-10-CM | POA: Diagnosis not present

## 2016-02-02 LAB — STREP GROUP A AG, W/REFLEX TO CULT: STREGTOCOCCUS GROUP A AG SCREEN: DETECTED — AB

## 2016-02-02 LAB — CBC
HEMATOCRIT: 37.1 % (ref 36.0–46.0)
HEMOGLOBIN: 12.3 g/dL (ref 12.0–15.0)
MCH: 31 pg (ref 26.0–34.0)
MCHC: 33.2 g/dL (ref 30.0–36.0)
MCV: 93.5 fL (ref 78.0–100.0)
Platelets: 303 10*3/uL (ref 150–400)
RBC: 3.97 MIL/uL (ref 3.87–5.11)
RDW: 12.7 % (ref 11.5–15.5)
WBC: 13 10*3/uL — ABNORMAL HIGH (ref 4.0–10.5)

## 2016-02-02 LAB — INFLUENZA A AND B AG, IMMUNOASSAY
Influenza A Antigen: NOT DETECTED
Influenza B Antigen: NOT DETECTED

## 2016-02-02 LAB — I-STAT TROPONIN, ED: Troponin i, poc: 0.01 ng/mL (ref 0.00–0.08)

## 2016-02-02 LAB — BASIC METABOLIC PANEL
ANION GAP: 12 (ref 5–15)
BUN: 8 mg/dL (ref 6–20)
CHLORIDE: 101 mmol/L (ref 101–111)
CO2: 25 mmol/L (ref 22–32)
Calcium: 9.6 mg/dL (ref 8.9–10.3)
Creatinine, Ser: 0.84 mg/dL (ref 0.44–1.00)
GFR calc Af Amer: >60 mL/min (ref >60)
GFR calc non Af Amer: >60 mL/min (ref >60)
GLUCOSE: 116 mg/dL — AB (ref 65–99)
Potassium: 3.7 mmol/L (ref 3.5–5.1)
Sodium: 138 mmol/L (ref 135–145)

## 2016-02-02 LAB — I-STAT CG4 LACTIC ACID, ED: Lactic Acid, Venous: 0.99 mmol/L (ref 0.5–2.0)

## 2016-02-02 MED ORDER — ACETAMINOPHEN 325 MG PO TABS: 650.0000 mg | ORAL_TABLET | Freq: Four times a day (QID) | ORAL | Status: AC | PRN

## 2016-02-02 MED ORDER — PENICILLIN G BENZATHINE 1200000 UNIT/2ML IM SUSP
1.2000 10*6.[IU] | Freq: Once | INTRAMUSCULAR | Status: AC
  Administered 2016-02-02: 1.2 10*6.[IU] via INTRAMUSCULAR
  Filled 2016-02-02: qty 2

## 2016-02-02 MED ORDER — SODIUM CHLORIDE 0.9 % IV BOLUS (SEPSIS)
1000.0000 mL | Freq: Once | INTRAVENOUS | Status: AC
Start: 2016-02-02 — End: 2016-02-02
  Administered 2016-02-02: 1000 mL via INTRAVENOUS

## 2016-02-02 MED ORDER — ACETAMINOPHEN 325 MG PO TABS
650.0000 mg | ORAL_TABLET | Freq: Once | ORAL | Status: AC
Start: 2016-02-02 — End: 2016-02-02
  Administered 2016-02-02: 650 mg via ORAL

## 2016-02-02 MED ORDER — ACETAMINOPHEN 325 MG PO TABS
ORAL_TABLET | ORAL | Status: AC
  Filled 2016-02-02: qty 2

## 2016-02-02 NOTE — ED Notes (Signed)
Pt here from PCP for eval of near syncope; pt had EKG that was different from previous and pt was also diagnosed with strep throat but not treated

## 2016-02-02 NOTE — ED Provider Notes (Signed)
Medical screening examination/treatment/procedure(s) were conducted as a shared visit with non-physician practitioner(s) and myself.  I personally evaluated the patient during the encounter.   EKG Interpretation   Date/Time:  Wednesday February 02 2016 12:13:49 EDT Ventricular Rate:  85 PR Interval:  106 QRS Duration: 84 QT Interval:  348 QTC Calculation: 414 R Axis:   85 Text Interpretation:  Sinus rhythm with short PR Right atrial enlargement  Borderline ECG nonspecific st t wave changes since last tracing Confirmed  by Lora Glomski  MD-J, Mallisa Alameda [id number]) on 02/02/2016 1:04:59 PM      Pt presented to the ED for near syncope from her doctors office.  Pt has had a sore throat and poor po intake.  Strep positive in the office.  Treated with iv fluid, pcn im.  EKG reassuring.  Doubt cardiac etiology.  Suspect sx related to her strep throat, dehydration due to poor po intake.  Dorie Rank, MD 02/03/16 [id number]

## 2016-02-02 NOTE — Progress Notes (Signed)
Patient ID: [name] MRN: [id number], DOB: 06-Mar-1951, 65 y.o. Date of Encounter: @DATE @  Chief Complaint:  Chief Complaint  Patient presents with  . sick x 1 day    sever body aches, chills, bad sore throat, very nauseated    HPI: 65 y.o. year old white female  presents with above.   States that she was in her usual state of health until yesterday. States that on Sunday 01/30/16 --[name] went to church and was with her grandchildren etc.  States that yesterday her throat felt a little sore. Says that by last night she was having severe muscle aches. Severe aches in her back and her arms. Says that she could not get comfortable last night.  Says that her throat feels raw and very sore.  Says this morning she had some headache. Says that yesterday she developed a little bit of nasal congestion and a little cough. Says this morning she just did not have any appetite and didn't even want her coffee and that's when her husband said that she needed to come to the doctor then.  When she stood up in the waiting room to come to her exam room she felt presyncope. She says that she was not feeling that way until she stood up. Says that prior to that she was feeling the diffuse body aches and weak but no presyncope until she stood up. Says that she does have a history of SVT but that it happens very rarely and says that she is not feeling the palpitations like she usually feels when she has SVT.  States that she is having no chest pressure heaviness tightness. No increased shortness of breath.  No discomfort in her neck or her left arm. No diaphoresis.  No other complaints or concerns.  States that she is married and her husband is not sick. Says it is possible that she picked up germs Sunday when she was with her grandchildren and at church. However, no known sick contacts.    Past Medical History  Diagnosis Date  . Hyperlipidemia   . Osteopenia      Home  Meds: Outpatient Prescriptions Prior to Visit  Medication Sig Dispense Refill  . acetaminophen (TYLENOL) 500 MG tablet Take 500 mg by mouth every morning.    . Calcium Carbonate-Vit D-Min (CALCIUM 1200 PO) Take 1,200 mg by mouth.    . cholecalciferol (VITAMIN D) 1000 UNITS tablet Take 1,000 Units by mouth daily.    . diclofenac sodium (VOLTAREN) 1 % GEL Apply 2 g topically 4 (four) times daily. 100 g 1  . gabapentin (NEURONTIN) 300 MG capsule TAKE 1 CAPSULE BY MOUTH 3 TIMES DAILY. 90 capsule 5  . meloxicam (MOBIC) 15 MG tablet Take 1 tablet (15 mg total) by mouth daily. 90 tablet 2  . Omega-3 Fatty Acids (FISH OIL) 1200 MG CAPS Take 1 capsule by mouth daily.    . simvastatin (ZOCOR) 10 MG tablet Take 1 tablet (10 mg total) by mouth daily. 90 tablet 3   No facility-administered medications prior to visit.    Allergies:  Allergies  Allergen Reactions  . Asa [Aspirin] Other (See Comments)    Stomach irritation  . Ibuprofen Other (See Comments)    Stomach irritation    Social History   Social History  . Marital Status: Married    Spouse Name: N/A  . Number of Children: N/A  . Years of Education: N/A   Occupational History  . Not on file.  Social History Main Topics  . Smoking status: Former Smoker    Types: Cigarettes    Quit date: 12/14/1997  . Smokeless tobacco: Not on file  . Alcohol Use: No  . Drug Use: No  . Sexual Activity: Not Currently   Other Topics Concern  . Not on file   Social History Narrative    Family History  Problem Relation Age of Onset  . Depression Mother   . Cancer Mother     breast s/p mastectomy  . Alzheimer's disease Father   . Early death Maternal Grandfather 10    Mill Accident     Review of Systems:  See HPI for pertinent ROS. All other ROS negative.    Physical Exam: Pulse 72, temperature 99.7 F (37.6 C), temperature source Oral, resp. rate 18, weight 155 lb (70.308 kg), SpO2 97 %., Body mass index is 27.46  kg/(m^2). General: WNWD WF. When she first entered exam room, became pale. Once she laid down on exam table, her color returned to normal. Appears in no acute distress. Head: Normocephalic, atraumatic, eyes without discharge, sclera non-icteric, nares are without discharge. Bilateral auditory canals clear, TM's are without perforation, pearly grey and translucent with reflective cone of light bilaterally. Oral cavity moist, posterior pharynx with moderate erythema. No exudate, no peritonsillar abscess.  Neck: Supple. No thyromegaly. No lymphadenopathy. Lungs: Clear bilaterally to auscultation without wheezes, rales, or rhonchi. Breathing is unlabored. Heart: RRR with S1 S2. No murmurs, rubs, or gallops. Musculoskeletal:  Strength and tone normal for age. Extremities/Skin: Warm and dry. Neuro: Alert and oriented X 3. Moves all extremities spontaneously. Gait is normal. CNII-XII grossly in tact. Psych:  Responds to questions appropriately with a normal affect.   EKG shows normal sinus rhythm. However there is some ST depression in leads II, III, AVF which are new compared to her EKG 05/24/2011  ASSESSMENT AND PLAN:  65 y.o. year old female with   1. Presyncope, unspecified type - EKG 12-Lead - COMPLETE METABOLIC PANEL WITH GFR - TSH - CBC  2. Fever and chills - STREP GROUP A AG, W/REFLEX TO CULT - Influenza A and B Ag, Immunoassay - CBC  3. Sore throat - CBC  Rapid strep test is positive for strep pharyngitis. EKG shows sinus rhythm with some ST depressions in leads II, III, AVF which are new compared to her tracing performed 05/24/2011. Because of these EKG changes she is to go directly to the emergency room and have further evaluation. Her husband is here with her. She is having no current angina symptoms at all. She is stable to transport via private vehicle and her husband can drive her directly to the emergency room. Patient and husband are both agreeable and voice  understanding. She is to go directly to [facility] emergency room. Copies of these EKGs have been provided for him to give to the triage nurse. As well she will need to receive antibiotics to cover strep pharyngitis at the ER and I have also discussed this with patient and husband and they voice understanding and agree.  [address] [place], [place], [facility] 02/02/2016 10:48 AM

## 2016-02-02 NOTE — Discharge Instructions (Signed)
Strep Throat Strep throat is a bacterial infection of the throat. Your health care provider may call the infection tonsillitis or pharyngitis, depending on whether there is swelling in the tonsils or at the back of the throat. Strep throat is most common during the cold months of the year in children who are 39-66 years of age, but it can happen during any season in people of any age. This infection is spread from person to person (contagious) through coughing, sneezing, or close contact. CAUSES Strep throat is caused by the bacteria called Streptococcus pyogenes. RISK FACTORS This condition is more likely to develop in:  People who spend time in crowded places where the infection can spread easily.  People who have close contact with someone who has strep throat. SYMPTOMS Symptoms of this condition include:  Fever or chills.   Redness, swelling, or pain in the tonsils or throat.  Pain or difficulty when swallowing.  White or yellow spots on the tonsils or throat.  Swollen, tender glands in the neck or under the jaw.  Red rash all over the body (rare). DIAGNOSIS This condition is diagnosed by performing a rapid strep test or by taking a swab of your throat (throat culture test). Results from a rapid strep test are usually ready in a few minutes, but throat culture test results are available after one or two days. TREATMENT This condition is treated with antibiotic medicine. HOME CARE INSTRUCTIONS Medicines  Take over-the-counter and prescription medicines only as told by your health care provider.  Take your antibiotic as told by your health care provider. Do not stop taking the antibiotic even if you start to feel better.  Have family members who also have a sore throat or fever tested for strep throat. They may need antibiotics if they have the strep infection. Eating and Drinking  Do not share food, drinking cups, or personal items that could cause the infection to spread to  other people.  If swallowing is difficult, try eating soft foods until your sore throat feels better.  Drink enough fluid to keep your urine clear or pale yellow. General Instructions  Gargle with a salt-water mixture 3-4 times per day or as needed. To make a salt-water mixture, completely dissolve -1 tsp of salt in 1 cup of warm water.  Make sure that all household members wash their hands well.  Get plenty of rest.  Stay home from school or work until you have been taking antibiotics for 24 hours.  Keep all follow-up visits as told by your health care provider. This is important. SEEK MEDICAL CARE IF:  The glands in your neck continue to get bigger.  You develop a rash, cough, or earache.  You cough up a thick liquid that is green, yellow-brown, or bloody.  You have pain or discomfort that does not get better with medicine.  Your problems seem to be getting worse rather than better.  You have a fever. SEEK IMMEDIATE MEDICAL CARE IF:  You have new symptoms, such as vomiting, severe headache, stiff or painful neck, chest pain, or shortness of breath.  You have severe throat pain, drooling, or changes in your voice.  You have swelling of the neck, or the skin on the neck becomes red and tender.  You have signs of dehydration, such as fatigue, dry mouth, and decreased urination.  You become increasingly sleepy, or you cannot wake up completely.  Your joints become red or painful.   This information is not intended to replace  advice given to you by your health care provider. Make sure you discuss any questions you have with your health care provider.   Document Released: 09/29/2000 Document Revised: 06/23/2015 Document Reviewed: 01/25/2015 Elsevier Interactive Patient Education 2016 [employer]. Near-Syncope Near-syncope (commonly known as near fainting) is sudden weakness, dizziness, or feeling like you might pass out. During an episode of near-syncope, you may also  develop pale skin, have tunnel vision, or feel sick to your stomach (nauseous). Near-syncope may occur when getting up after sitting or while standing for a long time. It is caused by a sudden decrease in blood flow to the brain. This decrease can result from various causes or triggers, most of which are not serious. However, because near-syncope can sometimes be a sign of something serious, a medical evaluation is required. The specific cause is often not determined. HOME CARE INSTRUCTIONS  Monitor your condition for any changes. The following actions may help to alleviate any discomfort you are experiencing:  Have someone stay with you until you feel stable.  Lie down right away and prop your feet up if you start feeling like you might faint. Breathe deeply and steadily. Wait until all the symptoms have passed. Most of these episodes last only a few minutes. You may feel tired for several hours.   Drink enough fluids to keep your urine clear or pale yellow.   If you are taking blood pressure or heart medicine, get up slowly when seated or lying down. Take several minutes to sit and then stand. This can reduce dizziness.  Follow up with your health care provider as directed. SEEK IMMEDIATE MEDICAL CARE IF:   You have a severe headache.   You have unusual pain in the chest, abdomen, or back.   You are bleeding from the mouth or rectum, or you have black or tarry stool.   You have an irregular or very fast heartbeat.   You have repeated fainting or have seizure-like jerking during an episode.   You faint when sitting or lying down.   You have confusion.   You have difficulty walking.   You have severe weakness.   You have vision problems.  MAKE SURE YOU:   Understand these instructions.  Will watch your condition.  Will get help right away if you are not doing well or get worse.   This information is not intended to replace advice given to you by your health care  provider. Make sure you discuss any questions you have with your health care provider.   Document Released: 10/02/2005 Document Revised: 10/07/2013 Document Reviewed: 03/07/2013 Elsevier Interactive Patient Education [employer].

## 2016-02-02 NOTE — ED Provider Notes (Signed)
CSN: [id number]     Arrival date & time 02/02/16  1141 History   First MD Initiated Contact with Patient 02/02/16 1239     Chief Complaint  Patient presents with  . Sore Throat  . Near Syncope    [name] is a 65 y.o. female who presents to the ED from her PCP's office complaining of a sore throat and a near syncopal episode today. She reports she began having a sore throat yesterday with associated fevers, chills and body aches. She went to her PCP today and had a positive strep test. She was moving from a chair to an exam table when she felt lightheaded and had a near-syncopal episode. She did not lose consciousness. She reports positional lightheadedness. She reports she was having fevers and chills all night last night. She's had nothing to eat or drink today. She reports pain with swallowing but no trouble swallowing. Her primary care provider did an EKG and noticed some changes from her previous. They sent her to the emergency department for further evaluation. She denies neck pain, chest pain, shortness of breath, dizziness, headache, numbness, tingling, weakness, trouble swallowing, drooling, abdominal pain, nausea, vomiting or diarrhea.  HPI  Past Medical History  Diagnosis Date  . Hyperlipidemia   . Osteopenia    Past Surgical History  Procedure Laterality Date  . Carpel tunnel    . Abdominal hysterectomy      age 50, NO BSO  . Breast surgery      biopsy x 2, benign   Family History  Problem Relation Age of Onset  . Depression Mother   . Cancer Mother     breast s/p mastectomy  . Alzheimer's disease Father   . Early death Maternal Grandfather 79    [place] Accident   Social History  Substance Use Topics  . Smoking status: Former Smoker    Types: Cigarettes    Quit date: 12/14/1997  . Smokeless tobacco: None  . Alcohol Use: No   OB History    No data available     Review of Systems  Constitutional: Positive for fever and chills.  HENT: Positive for sore  throat. Negative for congestion, trouble swallowing and voice change.   Eyes: Negative for visual disturbance.  Respiratory: Negative for cough and shortness of breath.   Cardiovascular: Negative for chest pain and palpitations.  Gastrointestinal: Negative for nausea, vomiting, abdominal pain and diarrhea.  Genitourinary: Negative for dysuria.  Musculoskeletal: Negative for back pain, neck pain and neck stiffness.  Skin: Negative for rash.  Neurological: Positive for light-headedness. Negative for dizziness, syncope (near syncope ), weakness, numbness and headaches.      Allergies  Asa and Ibuprofen  Home Medications   Prior to Admission medications   Medication Sig Start Date End Date Taking? Authorizing Provider  acetaminophen (TYLENOL) 325 MG tablet Take 2 tablets (650 mg total) by mouth every 6 (six) hours as needed for mild pain, moderate pain or fever. 02/02/16   Waynetta Pean, PA-C  Calcium Carbonate-Vit D-Min (CALCIUM 1200 PO) Take 1,200 mg by mouth.    Historical Provider, MD  cholecalciferol (VITAMIN D) 1000 UNITS tablet Take 1,000 Units by mouth daily.    Historical Provider, MD  diclofenac sodium (VOLTAREN) 1 % GEL Apply 2 g topically 4 (four) times daily. 07/01/15   Susy Frizzle, MD  gabapentin (NEURONTIN) 300 MG capsule TAKE 1 CAPSULE BY MOUTH 3 TIMES DAILY. 12/21/15   Susy Frizzle, MD  meloxicam [facility]) 15  MG tablet Take 1 tablet (15 mg total) by mouth daily. 08/02/15   Susy Frizzle, MD  Omega-3 Fatty Acids (FISH OIL) 1200 MG CAPS Take 1 capsule by mouth daily.    Historical Provider, MD  simvastatin (ZOCOR) 10 MG tablet Take 1 tablet (10 mg total) by mouth daily. 05/05/15   Susy Frizzle, MD   BP 99/53 mmHg  Pulse 80  Temp(Src) 99.3 F (37.4 C) (Oral)  Resp 18  SpO2 98% Physical Exam  Constitutional: She is oriented to person, place, and time. She appears well-developed and well-nourished. No distress.  Nontoxic appearing.  HENT:  Head: Normocephalic  and atraumatic.  Right Ear: External ear normal.  Left Ear: External ear normal.  Mouth/Throat: No oropharyngeal exudate.  Mild bilateral tonsillar hypertrophy without exudates. Uvula is midline without edema. No peritonsillar abscess. No trismus. Tongue protrusion is normal. No drooling. Mucous membranes are moist.  Eyes: Conjunctivae are normal. Pupils are equal, round, and reactive to light. Right eye exhibits no discharge. Left eye exhibits no discharge.  Neck: Normal range of motion. Neck supple. No JVD present.  Cardiovascular: Normal rate, regular rhythm, normal heart sounds and intact distal pulses.  Exam reveals no gallop and no friction rub.   No murmur heard. Pulmonary/Chest: Effort normal and breath sounds normal. No stridor. No respiratory distress. She has no wheezes. She has no rales.  Lungs are clear to auscultation bilaterally.  Abdominal: Soft. There is no tenderness. There is no guarding.  Musculoskeletal: She exhibits no edema or tenderness.  Lymphadenopathy:    She has no cervical adenopathy.  Neurological: She is alert and oriented to person, place, and time. No cranial nerve deficit. Coordination normal.  The patient is alert and oriented 3. Cranial nerves are intact. Sensation is intact in her bilateral upper and lower extremities. Speech is clear and coherent.  Skin: Skin is warm and dry. No rash noted. She is not diaphoretic. No erythema. No pallor.  Psychiatric: She has a normal mood and affect. Her behavior is normal.  Nursing note and vitals reviewed.   ED Course  Procedures (including critical care time) Labs Review Labs Reviewed  BASIC METABOLIC PANEL - Abnormal; Notable for the following:    Glucose, Bld 116 (*)    All other components within normal limits  CBC - Abnormal; Notable for the following:    WBC 13.0 (*)    All other components within normal limits  I-STAT CG4 LACTIC ACID, ED  I-STAT TROPOININ, ED    Imaging Review No results found. I  have personally reviewed and evaluated these lab results as part of my medical decision-making.   EKG Interpretation   Date/Time:  Wednesday February 02 2016 12:13:49 EDT Ventricular Rate:  85 PR Interval:  106 QRS Duration: 84 QT Interval:  348 QTC Calculation: 414 R Axis:   85 Text Interpretation:  Sinus rhythm with short PR Right atrial enlargement  Borderline ECG nonspecific st t wave changes since last tracing Confirmed  by KNAPP  MD-J, JON ([id number]) on 02/02/2016 1:04:59 PM      Filed Vitals:   02/02/16 1400 02/02/16 1430 02/02/16 1515 02/02/16 1529  BP: 90/48 96/50 99/53    Pulse: 75 73 80   Temp:    99.3 F (37.4 C)  TempSrc:    Oral  Resp: 20 17 18    SpO2: 96% 99% 98%      MDM   Meds given in ED:  Medications  acetaminophen (TYLENOL) 325 MG tablet (not administered)  acetaminophen (TYLENOL) tablet 650 mg (650 mg Oral Given 02/02/16 1204)  sodium chloride 0.9 % bolus 1,000 mL (0 mLs Intravenous Stopped 02/02/16 1529)  penicillin g benzathine (BICILLIN LA) 1200000 UNIT/2ML injection 1.2 Million Units (1.2 Million Units Intramuscular Given 02/02/16 1354)    Discharge Medication List as of 02/02/2016  3:27 PM      Final diagnoses:  Strep throat  Near syncope  Positional lightheadedness   This is a 65 y.o. female who presents to the ED from her PCP's office complaining of a sore throat and a near syncopal episode today. She reports she began having a sore throat yesterday with associated fevers, chills and body aches. She went to her PCP today and had a positive strep test. She was moving from a chair to an exam table when she felt lightheaded and had a near-syncopal episode. She did not lose consciousness. She reports positional lightheadedness. She reports she was having fevers and chills all night last night. She's had nothing to eat or drink today. She reports pain with swallowing but no trouble swallowing. Her primary care provider did an EKG and noticed some changes from  her previous. They sent her to the emergency department for further evaluation. She denies having any chest pain or shortness of breath. On arrival to the emergency department the patient's temperature is 100.2. On my exam she is nontoxic appearing. She does have some mild bilateral tonsillar hypertrophy without exudate. Uvula is midline without edema. No peritonsillar abscess. No trismus. No drooling. Mucous membranes are moist. Lactic acid is within normal limits. Troponin is 0.01. BMP is unremarkable. CBC is remarkable only for a leukocytosis with a white count of 13,000. EKG shows nonspecific T-wave changes that are new from her last tracing. No STEMI.  She has no focal neurological deficits. We'll provide with fluid bolus, Tylenol, and Bicillin IM. Will check orthostatic vital signs. Prior to fluid bolus patient reported feeling positionally lightheaded.  At reevaluation patient reports she is feeling much better. She is tolerating ginger ale and graham crackers without difficulty. She ambulated for me in the room without complaint of lightheadedness. She reports feeling ready for discharge. Patient's last blood pressure is 99/53. She reports this is normal for her and she usually runs low. She denies feeling any lightheadedness or difficulty ambulating. I suspect this patient's symptoms were related to being dehydrated and being positionally lightheaded. I have low suspicion for ACS at this time with this workup. Will discharge with prescriptions for Tylenol to use as needed for fevers. I discussed the expected course and treatment of strep throat. I discussed return precautions. I advised the patient to follow-up with their primary care provider this week. I advised the patient to return to the emergency department with new or worsening symptoms or new concerns. The patient verbalized understanding and agreement with plan.    This patient was discussed with and evaluated by Dr. Tomi Bamberger who agrees with  assessment and plan.   Waynetta Pean, PA-C 02/02/16 [place], MD 02/03/16 [phone]

## 2016-02-14 MED FILL — MELOXICAM 15 MG TABLET: 15 | 90 days supply | Qty: 90 | Fill #2

## 2016-02-14 MED FILL — GABAPENTIN 300 MG CAPSULE: 300 | 30 days supply | Qty: 90 | Fill #1

## 2016-02-21 ENCOUNTER — Encounter: Payer: Self-pay | Admitting: Physician Assistant

## 2016-02-21 ENCOUNTER — Ambulatory Visit (INDEPENDENT_AMBULATORY_CARE_PROVIDER_SITE_OTHER): Payer: PPO | Admitting: Physician Assistant

## 2016-02-21 VITALS — BP 118/80 | HR 76 | Temp 97.9°F | Resp 18 | Wt 153.0 lb

## 2016-02-21 DIAGNOSIS — L200 Besnier's prurigo: Secondary | ICD-10-CM

## 2016-02-21 DIAGNOSIS — L239 Allergic contact dermatitis, unspecified cause: Secondary | ICD-10-CM

## 2016-02-21 MED ORDER — METHYLPREDNISOLONE ACETATE 80 MG/ML IJ SUSP
80.0000 mg | Freq: Once | INTRAMUSCULAR | Status: AC
  Administered 2016-02-21: 80 mg via INTRAMUSCULAR

## 2016-02-21 NOTE — Progress Notes (Signed)
    Patient ID: [name] MRN: [id number], DOB: 08/02/1951, 65 y.o. Date of Encounter: 02/21/2016, 11:15 AM    Chief Complaint:  Chief Complaint  Patient presents with  . poison ivy all over     HPI: 65 y.o. year old white female presents with above.   Says this started Wednesday--02/16/16.  Says this big area on left arm just appeared this morning.  Now has itchy rash on right face/cheek, neck, right hand, left forearm, left arm, right thigh, right foot.  Applying [place] but no relief.      Home Meds:   Outpatient Prescriptions Prior to Visit  Medication Sig Dispense Refill  . acetaminophen (TYLENOL) 325 MG tablet Take 2 tablets (650 mg total) by mouth every 6 (six) hours as needed for mild pain, moderate pain or fever. 60 tablet 0  . Calcium Carbonate-Vit D-Min (CALCIUM 1200 PO) Take 1,200 mg by mouth.    . cholecalciferol (VITAMIN D) 1000 UNITS tablet Take 1,000 Units by mouth daily.    . diclofenac sodium (VOLTAREN) 1 % GEL Apply 2 g topically 4 (four) times daily. 100 g 1  . gabapentin (NEURONTIN) 300 MG capsule TAKE 1 CAPSULE BY MOUTH 3 TIMES DAILY. 90 capsule 5  . meloxicam (MOBIC) 15 MG tablet Take 1 tablet (15 mg total) by mouth daily. 90 tablet 2  . Omega-3 Fatty Acids (FISH OIL) 1200 MG CAPS Take 1 capsule by mouth daily.    . simvastatin (ZOCOR) 10 MG tablet Take 1 tablet (10 mg total) by mouth daily. 90 tablet 3   No facility-administered medications prior to visit.    Allergies:  Allergies  Allergen Reactions  . Asa [Aspirin] Other (See Comments)    Stomach irritation  . Ibuprofen Other (See Comments)    Stomach irritation      Review of Systems: See HPI for pertinent ROS. All other ROS negative.    Physical Exam: Blood pressure 118/80, pulse 76, temperature 97.9 F (36.6 C), temperature source Oral, resp. rate 18, weight 153 lb (69.4 kg)., Body mass index is 27.11 kg/(m^2). General:  WNWD WF. Appears in no acute distress. Neck: Supple. No  thyromegaly. No lymphadenopathy. Lungs: Clear bilaterally to auscultation without wheezes, rales, or rhonchi. Breathing is unlabored. Heart: Regular rhythm. No murmurs, rubs, or gallops. Msk:  Strength and tone normal for age. Extremities/Skin:  Right cheek/face--diffuse pink erythema. Posterior neck--diffuse pink erythema.  Dorsum Right hand--group of pink vessicles.  Left forearm--area of pink vessicles.  Left mid arm--diffuse pink erythema, papules x ~ 2inch area.  Right thigh--pink papules Right foot-dorsum of foot--pink vessicles.  Neuro: Alert and oriented X 3. Moves all extremities spontaneously. Gait is normal. CNII-XII grossly in tact. Psych:  Responds to questions appropriately with a normal affect.     ASSESSMENT AND PLAN:  65 y.o. year old female with  1. Allergic dermatitis Give DepoMedrol 80mg  IM now.  F/U if not improved in 24 hours or if does not resolve completely.   Signed, [address] [place], [place], [facility] 02/21/2016 11:15 AM

## 2016-02-21 NOTE — Addendum Note (Signed)
Addended by: Olena Mater on: 02/21/2016 11:40 AM   Modules accepted: Orders

## 2016-03-29 MED FILL — GABAPENTIN 300 MG CAPSULE: 300 | 30 days supply | Qty: 90 | Fill #2

## 2016-04-19 ENCOUNTER — Other Ambulatory Visit: Payer: Self-pay | Admitting: Family Medicine

## 2016-04-19 MED FILL — SIMVASTATIN 10 MG TABLET: 10 | 90 days supply | Qty: 90 | Fill #0

## 2016-05-09 MED FILL — GABAPENTIN 300 MG CAPSULE: 300 | 30 days supply | Qty: 90 | Fill #3

## 2016-05-12 ENCOUNTER — Other Ambulatory Visit: Payer: Self-pay | Admitting: *Deleted

## 2016-05-12 DIAGNOSIS — Z1231 Encounter for screening mammogram for malignant neoplasm of breast: Secondary | ICD-10-CM | POA: Diagnosis not present

## 2016-05-12 DIAGNOSIS — Z803 Family history of malignant neoplasm of breast: Secondary | ICD-10-CM | POA: Diagnosis not present

## 2016-05-12 LAB — HM MAMMOGRAPHY

## 2016-05-12 MED ORDER — MELOXICAM 15 MG PO TABS: 15.0000 mg | ORAL_TABLET | Freq: Every day | ORAL | 2 refills | Status: DC

## 2016-05-12 MED FILL — MELOXICAM 15 MG TABLET: 15 | 90 days supply | Qty: 90 | Fill #0

## 2016-05-12 NOTE — Telephone Encounter (Signed)
Received fax requesting refill on Mobic.   Refill appropriate and filled per protocol.

## 2016-05-18 ENCOUNTER — Encounter: Payer: Self-pay | Admitting: Family Medicine

## 2016-05-24 DIAGNOSIS — H2513 Age-related nuclear cataract, bilateral: Secondary | ICD-10-CM | POA: Diagnosis not present

## 2016-05-25 ENCOUNTER — Other Ambulatory Visit: Payer: PPO

## 2016-05-25 ENCOUNTER — Other Ambulatory Visit: Payer: Self-pay | Admitting: Family Medicine

## 2016-05-25 DIAGNOSIS — Z79899 Other long term (current) drug therapy: Secondary | ICD-10-CM

## 2016-05-25 DIAGNOSIS — Z0000 Encounter for general adult medical examination without abnormal findings: Secondary | ICD-10-CM

## 2016-05-25 DIAGNOSIS — I10 Essential (primary) hypertension: Secondary | ICD-10-CM

## 2016-05-25 DIAGNOSIS — M8580 Other specified disorders of bone density and structure, unspecified site: Secondary | ICD-10-CM

## 2016-05-25 DIAGNOSIS — E785 Hyperlipidemia, unspecified: Secondary | ICD-10-CM

## 2016-05-29 ENCOUNTER — Ambulatory Visit (INDEPENDENT_AMBULATORY_CARE_PROVIDER_SITE_OTHER): Payer: PPO | Admitting: Family Medicine

## 2016-05-29 ENCOUNTER — Encounter: Payer: Self-pay | Admitting: Family Medicine

## 2016-05-29 VITALS — BP 134/78 | HR 76 | Temp 98.0°F | Resp 16 | Ht 63.0 in | Wt 150.0 lb

## 2016-05-29 DIAGNOSIS — M5432 Sciatica, left side: Secondary | ICD-10-CM | POA: Diagnosis not present

## 2016-05-29 DIAGNOSIS — Z0000 Encounter for general adult medical examination without abnormal findings: Secondary | ICD-10-CM | POA: Diagnosis not present

## 2016-05-29 DIAGNOSIS — Z23 Encounter for immunization: Secondary | ICD-10-CM | POA: Diagnosis not present

## 2016-05-29 DIAGNOSIS — E785 Hyperlipidemia, unspecified: Secondary | ICD-10-CM

## 2016-05-29 LAB — COMPLETE METABOLIC PANEL WITH GFR
ALBUMIN: 4.8 g/dL (ref 3.6–5.1)
ALK PHOS: 54 U/L (ref 33–130)
ALT: 14 U/L (ref 6–29)
AST: 19 U/L (ref 10–35)
BUN: 11 mg/dL (ref 7–25)
CALCIUM: 10.1 mg/dL (ref 8.6–10.4)
CO2: 26 mmol/L (ref 20–31)
CREATININE: 0.83 mg/dL (ref 0.50–0.99)
Chloride: 99 mmol/L (ref 98–110)
GFR, EST AFRICAN AMERICAN: 85 mL/min (ref >=60)
GFR, EST NON AFRICAN AMERICAN: 74 mL/min (ref >=60)
Glucose, Bld: 86 mg/dL (ref 70–99)
Potassium: 5.2 mmol/L (ref 3.5–5.3)
Sodium: 138 mmol/L (ref 135–146)
TOTAL PROTEIN: 7.7 g/dL (ref 6.1–8.1)
Total Bilirubin: 0.7 mg/dL (ref 0.2–1.2)

## 2016-05-29 LAB — CBC WITH DIFFERENTIAL/PLATELET
Basophils Absolute: 0 cells/uL (ref 0–200)
Basophils Relative: 0 %
EOS ABS: 73 {cells}/uL (ref 15–500)
Eosinophils Relative: 1 %
HEMATOCRIT: 40.9 % (ref 35.0–45.0)
HEMOGLOBIN: 13.4 g/dL (ref 12.0–15.0)
Lymphs Abs: 2117 cells/uL (ref 850–3900)
MCH: 31.4 pg (ref 27.0–33.0)
MCHC: 32.8 g/dL (ref 32.0–36.0)
MCV: 95.8 fL (ref 80.0–100.0)
MONO ABS: 657 {cells}/uL (ref 200–950)
MONOS PCT: 9 %
MPV: 10.8 fL (ref 7.5–12.5)
Neutro Abs: 4453 cells/uL (ref 1500–7800)
Neutrophils Relative %: 61 %
PLATELETS: 348 10*3/uL (ref 140–400)
RBC: 4.27 MIL/uL (ref 3.80–5.10)
RDW: 12.6 % (ref 11.0–15.0)
WBC: 7.3 10*3/uL (ref 3.8–10.8)

## 2016-05-29 LAB — LIPID PANEL
Cholesterol: 189 mg/dL (ref 125–200)
HDL: 64 mg/dL (ref >=46)
LDL CALC: 93 mg/dL (ref <130)
TRIGLYCERIDES: 159 mg/dL — AB (ref <150)
Total CHOL/HDL Ratio: 3 Ratio (ref <=5.0)
VLDL: 32 mg/dL — ABNORMAL HIGH (ref <30)

## 2016-05-29 NOTE — Progress Notes (Signed)
Subjective:    Patient ID: [name], female    DOB: [birth year], 65 y.o.   MRN: [id number]  HPI Patient is a very pleasant 65 year old white female who is here today for complete physical exam. Her mammogram has already been done. She does not require Pap smears because of her history of a hysterectomy. She had osteoporosis on DEXA 2016 and is due for repeat in 2018.  On calcium and vit D. She is due for Prevnar 13. She is due for the shingles vaccine.  She continues to complain of left-sided sciatica. This is been present since September of last year. She had normal x-rays of the hip and femur. The pain begins in her left gluteus and radiates down the lateral left thigh into the left calf and into the left foot. The pain is severe. Past Medical History:  Diagnosis Date  . Hyperlipidemia   . Osteopenia    Past Surgical History:  Procedure Laterality Date  . ABDOMINAL HYSTERECTOMY     age 8, NO BSO  . BREAST SURGERY     biopsy x 2, benign  . Carpel tunnel     Current Outpatient Prescriptions on File Prior to Visit  Medication Sig Dispense Refill  . acetaminophen (TYLENOL) 325 MG tablet Take 2 tablets (650 mg total) by mouth every 6 (six) hours as needed for mild pain, moderate pain or fever. 60 tablet 0  . Calcium Carbonate-Vit D-Min (CALCIUM 1200 PO) Take 1,200 mg by mouth.    . cholecalciferol (VITAMIN D) 1000 UNITS tablet Take 1,000 Units by mouth daily.    . diclofenac sodium (VOLTAREN) 1 % GEL Apply 2 g topically 4 (four) times daily. 100 g 1  . gabapentin (NEURONTIN) 300 MG capsule TAKE 1 CAPSULE BY MOUTH 3 TIMES DAILY. 90 capsule 5  . meloxicam (MOBIC) 15 MG tablet Take 1 tablet (15 mg total) by mouth daily. 90 tablet 2  . Omega-3 Fatty Acids (FISH OIL) 1200 MG CAPS Take 1 capsule by mouth daily.    . simvastatin (ZOCOR) 10 MG tablet TAKE 1 TABLET BY MOUTH DAILY. 90 tablet 0   No current facility-administered medications on file prior to visit.    Allergies  Allergen  Reactions  . Asa [Aspirin] Other (See Comments)    Stomach irritation  . Ibuprofen Other (See Comments)    Stomach irritation   Social History   Social History  . Marital status: Married    Spouse name: N/A  . Number of children: N/A  . Years of education: N/A   Occupational History  . Not on file.   Social History Main Topics  . Smoking status: Former Smoker    Types: Cigarettes    Quit date: 12/14/1997  . Smokeless tobacco: Not on file  . Alcohol use No  . Drug use: No  . Sexual activity: Not Currently   Other Topics Concern  . Not on file   Social History Narrative  . No narrative on file   Family History  Problem Relation Age of Onset  . Depression Mother   . Cancer Mother     breast s/p mastectomy  . Alzheimer's disease Father   . Early death Maternal Grandfather 17    [place] Accident     Review of Systems  All other systems reviewed and are negative.      Objective:   Physical Exam  Constitutional: She is oriented to person, place, and time. She appears well-developed and well-nourished. No distress.  HENT:  Head: Normocephalic and atraumatic.  Right Ear: External ear normal.  Left Ear: External ear normal.  Nose: Nose normal.  Mouth/Throat: Oropharynx is clear and moist. No oropharyngeal exudate.  Eyes: Conjunctivae and EOM are normal. Pupils are equal, round, and reactive to light. Right eye exhibits no discharge. Left eye exhibits no discharge. No scleral icterus.  Neck: Normal range of motion. Neck supple. No JVD present. No tracheal deviation present. No thyromegaly present.  Cardiovascular: Normal rate, regular rhythm, normal heart sounds and intact distal pulses.  Exam reveals no gallop and no friction rub.   No murmur heard. Pulmonary/Chest: Effort normal and breath sounds normal. No stridor. No respiratory distress. She has no wheezes. She has no rales. She exhibits no tenderness.  Abdominal: Soft. Bowel sounds are normal. She exhibits no  distension and no mass. There is no tenderness. There is no rebound and no guarding.  Musculoskeletal: Normal range of motion. She exhibits no edema or tenderness.  Lymphadenopathy:    She has no cervical adenopathy.  Neurological: She is alert and oriented to person, place, and time. She has normal reflexes. No cranial nerve deficit. She exhibits normal muscle tone. Coordination normal.  Skin: Skin is warm. No rash noted. She is not diaphoretic. No erythema. No pallor.  Psychiatric: She has a normal mood and affect. Her behavior is normal. Judgment and thought content normal.  Vitals reviewed.         Assessment & Plan:  Routine general medical examination at a health care facility - Plan: CBC with Differential/Platelet, COMPLETE METABOLIC PANEL WITH GFR, Lipid panel  HLD (hyperlipidemia) - Plan: COMPLETE METABOLIC PANEL WITH GFR, Lipid panel  Left sided sciatica - Plan: MR Lumbar Spine Wo Contrast  Physical exam is completely normal. If the lesions worsen we can certainly treat him with cryotherapy. I will schedule her for colonoscopy when she request. I is due for bone density in 2018. Her mammogram is up to date.. We will check standard fasting lab work. She received Prevnar 13 today. Pneumovax 23 is up-to-date. We recommended a shingles vaccine but she will check on the price first. She refuses a colonoscopy but she will consent to cologuard.  We will schedule the patient for an MRI of the lumbar spine

## 2016-05-29 NOTE — Addendum Note (Signed)
Addended by: Shary Decamp B on: 05/29/2016 11:01 AM   Modules accepted: Orders

## 2016-05-30 ENCOUNTER — Encounter: Payer: Self-pay | Admitting: Family Medicine

## 2016-05-30 DIAGNOSIS — L570 Actinic keratosis: Secondary | ICD-10-CM | POA: Diagnosis not present

## 2016-05-30 DIAGNOSIS — L720 Epidermal cyst: Secondary | ICD-10-CM | POA: Diagnosis not present

## 2016-05-31 ENCOUNTER — Encounter: Payer: Self-pay | Admitting: Family Medicine

## 2016-06-02 ENCOUNTER — Encounter (INDEPENDENT_AMBULATORY_CARE_PROVIDER_SITE_OTHER): Payer: Self-pay

## 2016-06-12 DIAGNOSIS — Z1211 Encounter for screening for malignant neoplasm of colon: Secondary | ICD-10-CM | POA: Diagnosis not present

## 2016-06-12 DIAGNOSIS — Z1212 Encounter for screening for malignant neoplasm of rectum: Secondary | ICD-10-CM | POA: Diagnosis not present

## 2016-06-12 LAB — COLOGUARD: COLOGUARD: NEGATIVE

## 2016-06-23 ENCOUNTER — Ambulatory Visit
Admission: RE | Admit: 2016-06-23 | Discharge: 2016-06-23 | Disposition: A | Discharge status: Home / Self Care | Payer: PPO | Source: Ambulatory Visit | Attending: Family Medicine | Admitting: Family Medicine

## 2016-06-23 DIAGNOSIS — M5126 Other intervertebral disc displacement, lumbar region: Secondary | ICD-10-CM | POA: Diagnosis not present

## 2016-06-23 DIAGNOSIS — M5432 Sciatica, left side: Secondary | ICD-10-CM

## 2016-06-26 ENCOUNTER — Encounter: Payer: Self-pay | Admitting: Family Medicine

## 2016-07-17 ENCOUNTER — Other Ambulatory Visit: Payer: Self-pay | Admitting: Family Medicine

## 2016-07-17 MED FILL — SIMVASTATIN 10 MG TABLET: 10 | 90 days supply | Qty: 90 | Fill #0

## 2016-07-17 NOTE — Telephone Encounter (Signed)
RX refilled per protocol 

## 2016-07-31 ENCOUNTER — Ambulatory Visit (INDEPENDENT_AMBULATORY_CARE_PROVIDER_SITE_OTHER): Payer: PPO | Admitting: Podiatry

## 2016-07-31 ENCOUNTER — Ambulatory Visit (INDEPENDENT_AMBULATORY_CARE_PROVIDER_SITE_OTHER): Payer: PPO

## 2016-07-31 DIAGNOSIS — M21629 Bunionette of unspecified foot: Secondary | ICD-10-CM

## 2016-07-31 DIAGNOSIS — M79671 Pain in right foot: Secondary | ICD-10-CM | POA: Diagnosis not present

## 2016-07-31 DIAGNOSIS — M205X9 Other deformities of toe(s) (acquired), unspecified foot: Secondary | ICD-10-CM

## 2016-07-31 DIAGNOSIS — M79672 Pain in left foot: Secondary | ICD-10-CM | POA: Diagnosis not present

## 2016-07-31 DIAGNOSIS — M2041 Other hammer toe(s) (acquired), right foot: Secondary | ICD-10-CM | POA: Diagnosis not present

## 2016-07-31 DIAGNOSIS — M2042 Other hammer toe(s) (acquired), left foot: Secondary | ICD-10-CM

## 2016-07-31 NOTE — Progress Notes (Signed)
   Subjective:    Patient ID: [name], female    DOB: 08/03/1951, 65 y.o.   MRN: [id number]  HPI    Review of Systems     Objective:   Physical Exam        Assessment & Plan:

## 2016-07-31 NOTE — Progress Notes (Signed)
   Subjective:    Patient ID: [name], female    DOB: 02-27-1951, 65 y.o.   MRN: [id number]  HPI    Review of Systems  HENT: Positive for tinnitus.   Musculoskeletal: Positive for back pain.       Objective:   Physical Exam        Assessment & Plan:

## 2016-08-06 NOTE — Progress Notes (Signed)
Patient ID: [name], female   DOB: 1951-06-30, 65 y.o.   MRN: [id number] Subjective:  Patient presents today for evaluation of bilateral foot pain. Patient states that her right foot is more symptomatic than her left. Patient also states that she has no joint her great toe of the left foot. Patient presents today for surgical consult and evaluation and treatment.    Objective/Physical Exam General: The patient is alert and oriented x3 in no acute distress.  Dermatology: Skin is warm, dry and supple bilateral lower extremities. Negative for open lesions or macerations.  Vascular: Palpable pedal pulses bilaterally. No edema or erythema noted. Capillary refill within normal limits.  Neurological: Epicritic and protective threshold grossly intact bilaterally.   Musculoskeletal Exam: Range of motion within normal limits to all pedal and ankle joints bilateral. Muscle strength 5/5 in all groups bilateral.   Radiographic Exam:  Structural deformity noted to the bilateral feet all 3 views. Hammertoe contractures digits 2 through 5 noted bilaterally as well as tailor's bunionette deformity bilateral. Degenerative joint disease noted bilateral feet diffusely throughout opting particular joints.  Assessment: #1 hallux limitus with joint degeneration bilateral first MPJs #2 hammertoe deformity digits 2 through 5 bilateral #3 tailor's bunionette deformity bilateral #4 pain in bilateral feet   Plan of Care:  #1 Patient was evaluated. #2 today conservative versus surgical management and options were does. Patient opted for surgical management. Surgical intervention would be performed on the right foot first since it is more symptomatic. Surgery would consist of arthrodesis first MPJ, hammertoe correction 2 through 5 right foot, and tailor's bunionectomy right. #3 discussed all possible complications details of surgery. All patient questions answered. #4 patient would like to have surgery in  January 2018. Patient is to return to clinic in December 4 reevaluation and surgical consult again. #5 today prescription for anti-inflammatory pain cream dispensed through [facility]   Dr. Edrick Kins, [place]

## 2016-08-07 ENCOUNTER — Telehealth: Payer: Self-pay | Admitting: *Deleted

## 2016-08-07 MED FILL — MELOXICAM 15 MG TABLET: 15 | 90 days supply | Qty: 90 | Fill #1

## 2016-08-07 NOTE — Telephone Encounter (Signed)
Pt states Dr. Amalia Hailey was to order a cream. I reviewed LOV and pt was to get antiinflammatory cream from [facility]. Informed pt and faxed to [facility].

## 2016-08-08 MED ORDER — NONFORMULARY OR COMPOUNDED ITEM: 11 refills | Status: DC

## 2016-08-08 NOTE — Telephone Encounter (Signed)
Dr. Amalia Hailey ordered [facility] Antiinflammatory Cream. Faxed to [facility].

## 2016-09-15 IMAGING — MR MR LUMBAR SPINE W/O CM
4 of 5 series · 26 of 48 positions shown · non-contrast
Comparison: CT abdomen and pelvis 10/31/2011.

CLINICAL DATA: Low back pain radiating into the left hip and leg
for 1 year. No known injury. Initial encounter.

EXAM:
MRI LUMBAR SPINE WITHOUT CONTRAST
TECHNIQUE: Multiplanar, multisequence MR imaging of the lumbar spine was
performed. No intravenous contrast was administered.

[Series 5: T2 post-contrast · sagittal · 4.0mm · 0.55mm/px · 6 of 13 slices shown]
[im 1/13]
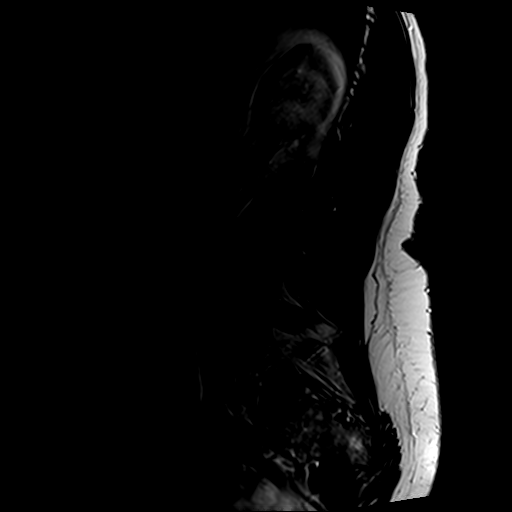
[im 3/13]
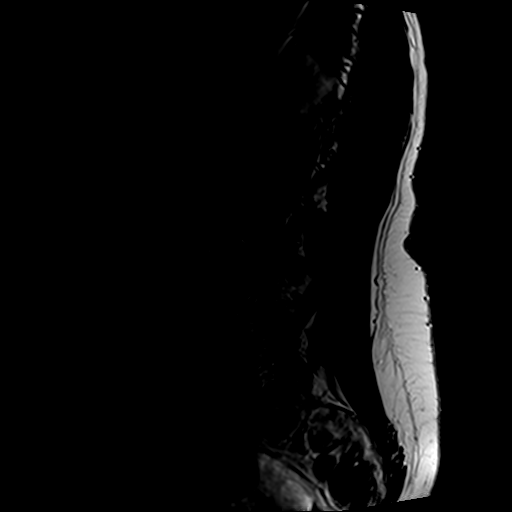
[im 5/13]
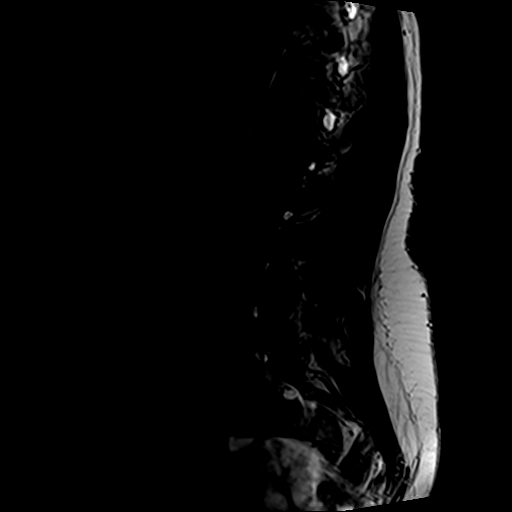
[im 8/13]
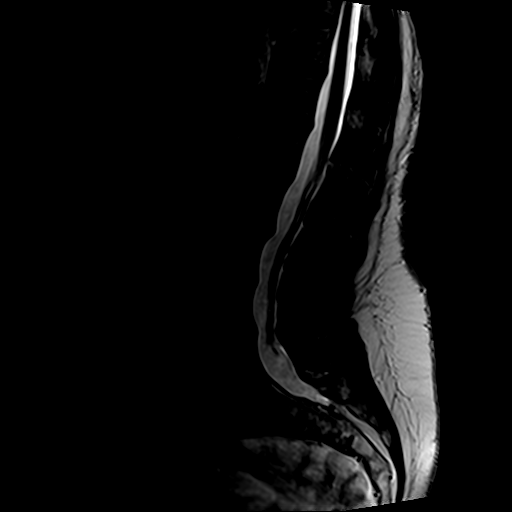
[im 10/13]
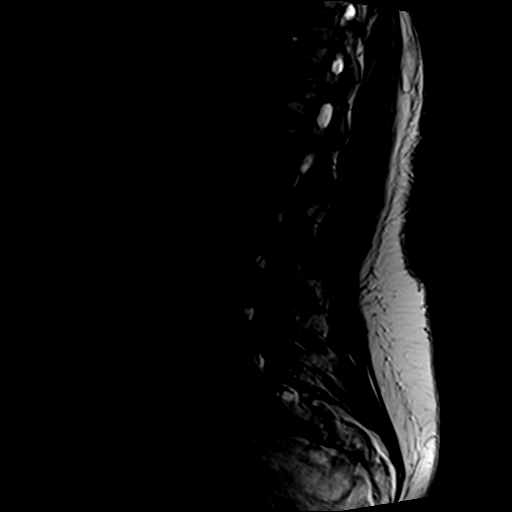
[im 13/13]
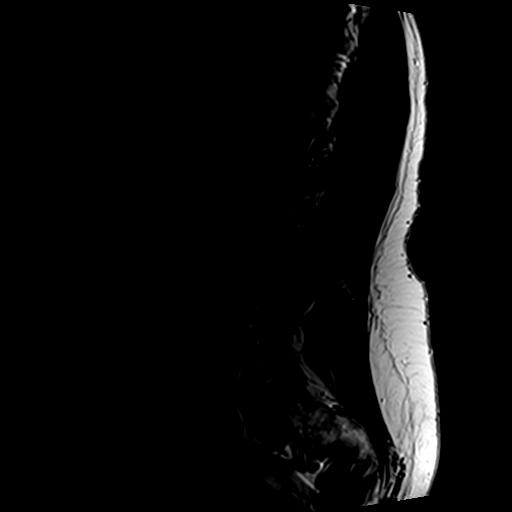

[Series 6: T1 · sagittal · 4.0mm · 0.55mm/px · 6 of 13 slices shown (1 of 2)]
[im 1/13]
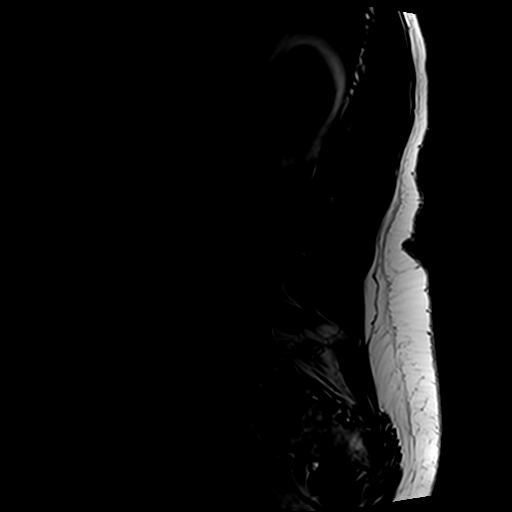
[im 3/13]
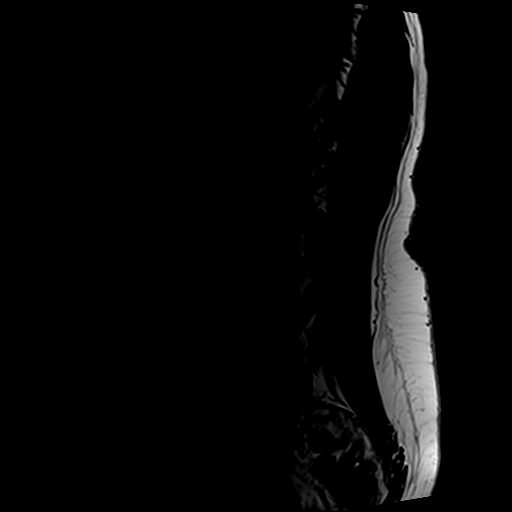
[im 5/13]
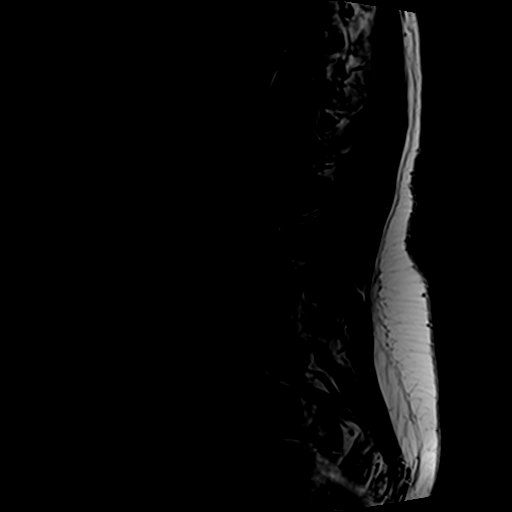
[im 8/13]
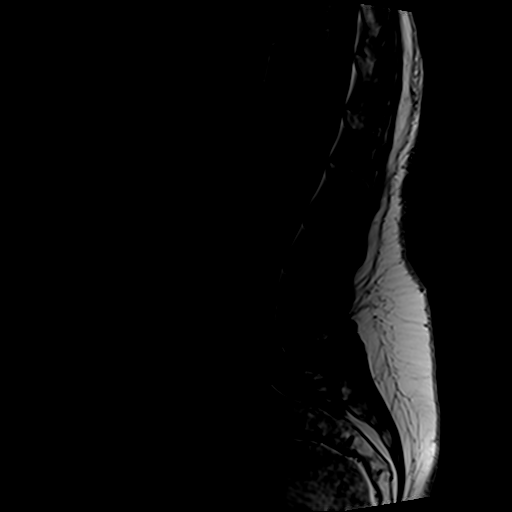
[im 10/13]
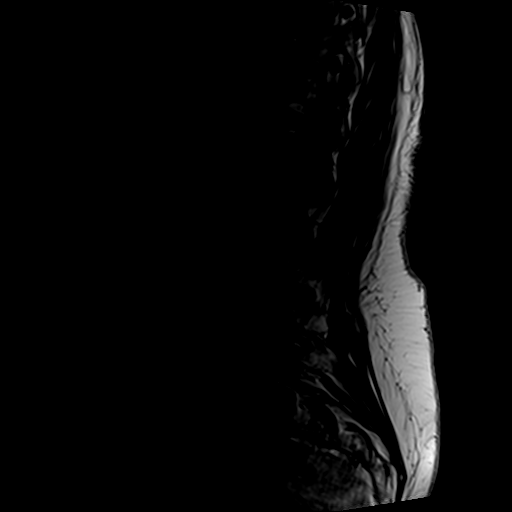
[im 13/13]
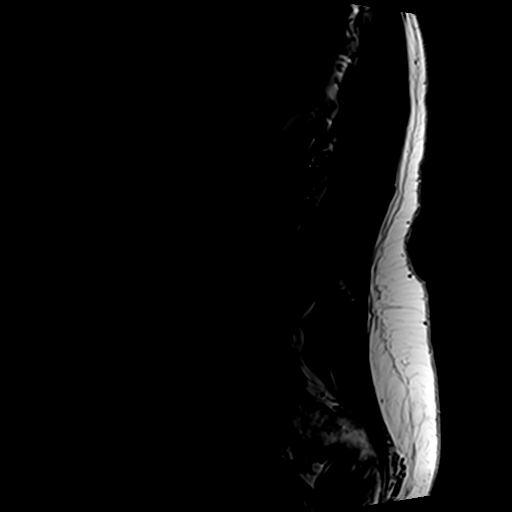

[Series 7: T2 · axial · 4.0mm · 0.70mm/px · z∈[+83,+268]mm · 9 of 34 slices shown]
[im 1/34]
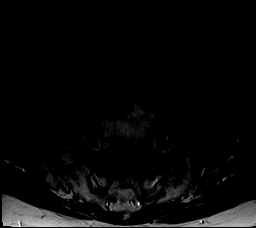
[im 5/34]
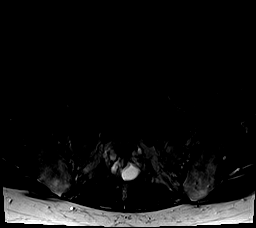
[im 10/34]
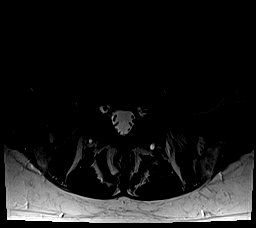
[im 15/34]
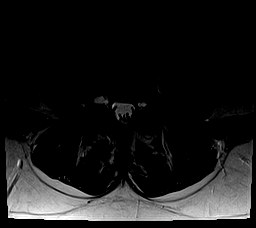
[im 17/34]
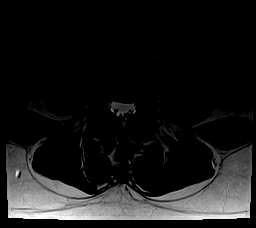
[im 19/34]
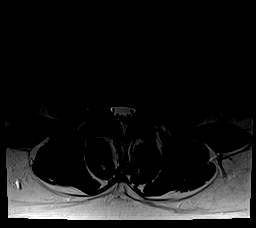
[im 24/34]
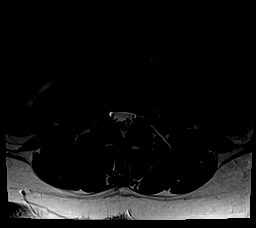
[im 29/34]
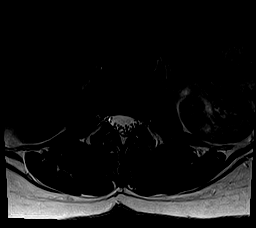
[im 34/34]
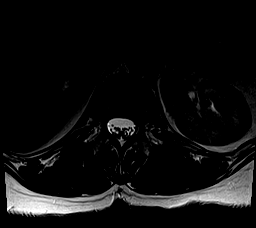

[Series 8: T1 · axial · 4.0mm · 0.35mm/px · z∈[+83,+243]mm · 5 of 34 slices shown (2 of 2)]
[im 1/34]
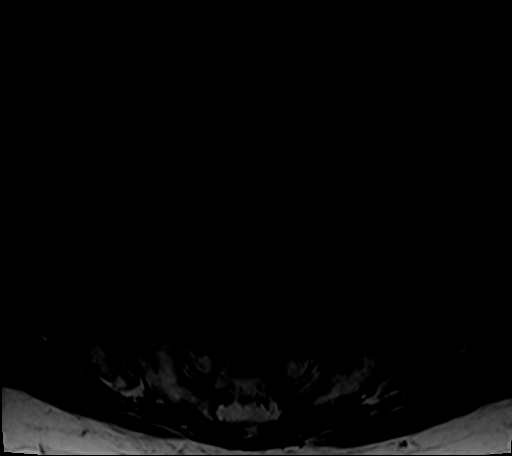
[im 5/34]
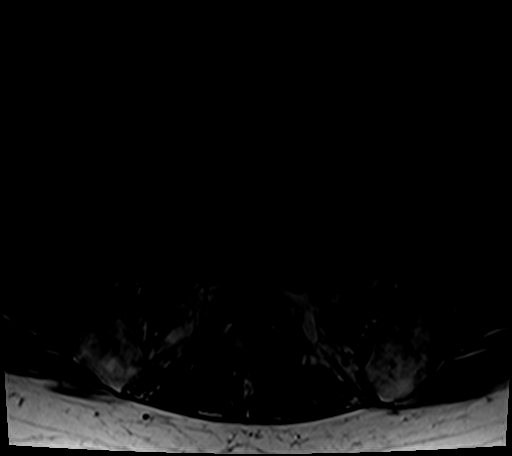
[im 10/34]
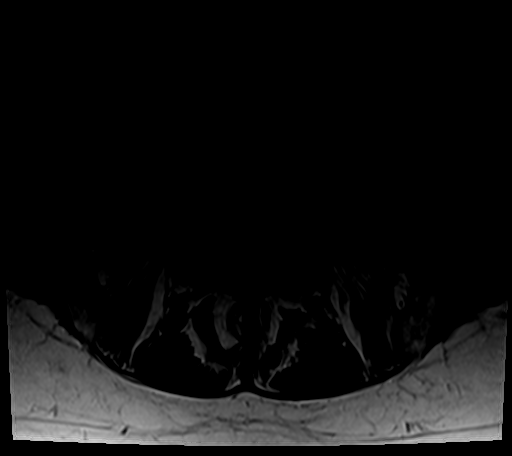
[im 17/34]
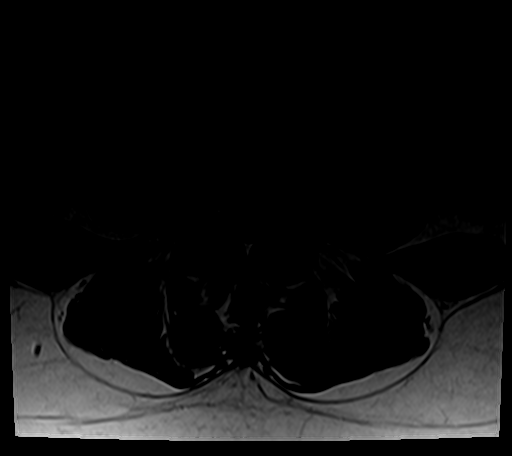
[im 29/34]
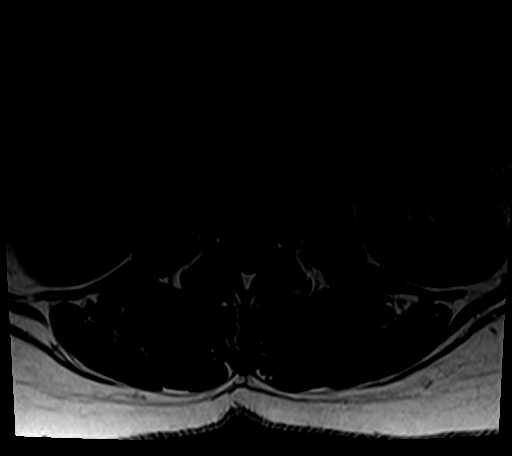

[26 of 48 positions shown; findings below may reference images not displayed]

FINDINGS: Segmentation:  Unremarkable.

Alignment: Trace retrolisthesis L2 on L3 is noted. Otherwise
unremarkable.

Vertebrae: Height is maintained. Marrow signal is mildly
heterogeneous without worrisome lesion identified.

Conus medullaris: Extends to the L1 level and appears normal.

Paraspinal and other soft tissues: Small T2 hyperintense lesion in
the right hepatic lobe is compatible with a cyst and seen on the
prior CT. Otherwise unremarkable

Disc levels:

T10-11, T11-12 and T12-L1 are imaged in the sagittal plane only.
Shallow central protrusions are seen at T11-12 and T12-L1 without
central canal or foraminal stenosis. T10-11 is unremarkable.

L1-2: Negative.

L2-3: Shallow disc bulge. The central canal and foramina are widely
patent.

L3-4: Shallow disc bulge and mild ligamentum flavum thickening.
Small cysts about the exiting nerve roots noted. No central canal or
foraminal stenosis.

L4-5: Ligamentum flavum thickening and a shallow disc bulge cause
mild central canal narrowing. The foramina are open. No nerve root
compression.

L5-S1: Shallow disc bulge.  Otherwise negative.
IMPRESSION: Spondylosis most notable at L4-5 where ligamentum flavum thickening
and a shallow disc bulge cause mild central canal narrowing. The
examination is otherwise unremarkable

## 2016-09-16 ENCOUNTER — Encounter: Payer: Self-pay | Admitting: Family Medicine

## 2016-09-25 ENCOUNTER — Ambulatory Visit (INDEPENDENT_AMBULATORY_CARE_PROVIDER_SITE_OTHER): Payer: PPO | Admitting: Podiatry

## 2016-09-25 DIAGNOSIS — M19071 Primary osteoarthritis, right ankle and foot: Secondary | ICD-10-CM

## 2016-09-25 DIAGNOSIS — T85848D Pain due to other internal prosthetic devices, implants and grafts, subsequent encounter: Secondary | ICD-10-CM | POA: Diagnosis not present

## 2016-09-25 DIAGNOSIS — M2041 Other hammer toe(s) (acquired), right foot: Secondary | ICD-10-CM | POA: Diagnosis not present

## 2016-09-25 DIAGNOSIS — M205X1 Other deformities of toe(s) (acquired), right foot: Secondary | ICD-10-CM | POA: Diagnosis not present

## 2016-09-25 DIAGNOSIS — M21621 Bunionette of right foot: Secondary | ICD-10-CM | POA: Diagnosis not present

## 2016-09-25 NOTE — Progress Notes (Signed)
Patient ID: [name], female   DOB: 03-03-1951, 65 y.o.   MRN: [id number] Subjective:  Patient presents today for evaluation of bilateral foot pain. Patient states that her right foot is more symptomatic than her left. Patient also states that she has no joint her great toe of the left foot. Patient presents today for surgical consult and evaluation and treatment.  Patient was last evaluated on 07/31/2016. On last visit we discussed surgery and instructed the patient to return to clinic for review of surgical consultation. Patient presents today for surgical consult and reevaluation     Objective/Physical Exam General: The patient is alert and oriented x3 in no acute distress.  Dermatology: Skin is warm, dry and supple bilateral lower extremities. Negative for open lesions or macerations.  Vascular: Palpable pedal pulses bilaterally. No edema or erythema noted. Capillary refill within normal limits.  Neurological: Epicritic and protective threshold grossly intact bilaterally.   Musculoskeletal Exam: Range of motion within normal limits to all pedal and ankle joints bilateral. Muscle strength 5/5 in all groups bilateral.   Radiographic Exam:  Structural deformity noted to the bilateral feet all 3 views. Hammertoe contractures digits 2 through 5 noted bilaterally as well as tailor's bunionette deformity bilateral. Degenerative joint disease noted bilateral feet diffusely throughout opting particular joints.  Assessment: #1 hallux limitus with joint degeneration bilateral first MPJs #2 hammertoe deformity digits 2 through 5 bilateral #3 tailor's bunionette deformity bilateral #4 pain in bilateral feet #5 history of bunionectomy right foot. DOS Sept 2012 #6 painful hardware right foot   Plan of Care:  #1 Patient was evaluated. #2 today conservative versus surgical management and options were does. Patient opted for surgical management. Surgical intervention would be performed on  the right foot first since it is more symptomatic. Surgery would consist of removal painful orthopedic hardware 4 right foot, arthrodesis first MPJ right foot, hammertoe correction 2-5 right foot, metatarsal phalangeal joint capsulotomy 2-5 right foot, and tailor's bunionectomy right. #3 discussed all possible complications details of surgery. All patient questions answered. No guarantees were expressed or implied #4 authorization for surgery initiated today  #5 cam boot dispensed today  #6 return to clinic 1 week postop   Edrick Kins, DPM [facility]  Dr. Edrick Kins, [address]                                        [place], [place] [address]                Office [phone]  Fax [phone]

## 2016-09-25 NOTE — Patient Instructions (Signed)

## 2016-09-28 ENCOUNTER — Ambulatory Visit (INDEPENDENT_AMBULATORY_CARE_PROVIDER_SITE_OTHER): Payer: PPO | Admitting: Physician Assistant

## 2016-09-28 ENCOUNTER — Encounter: Payer: Self-pay | Admitting: Physician Assistant

## 2016-09-28 VITALS — BP 100/62 | HR 88 | Temp 100.6°F | Resp 18 | Wt 145.0 lb

## 2016-09-28 DIAGNOSIS — J101 Influenza due to other identified influenza virus with other respiratory manifestations: Secondary | ICD-10-CM | POA: Diagnosis not present

## 2016-09-28 DIAGNOSIS — J020 Streptococcal pharyngitis: Secondary | ICD-10-CM | POA: Diagnosis not present

## 2016-09-28 DIAGNOSIS — R52 Pain, unspecified: Secondary | ICD-10-CM

## 2016-09-28 LAB — INFLUENZA A AND B AG, IMMUNOASSAY
INFLUENZA A ANTIGEN: DETECTED — AB
INFLUENZA B ANTIGEN: NOT DETECTED

## 2016-09-28 LAB — STREP GROUP A AG, W/REFLEX TO CULT: STREGTOCOCCUS GROUP A AG SCREEN: NOT DETECTED

## 2016-09-28 MED ORDER — OSELTAMIVIR PHOSPHATE 75 MG PO CAPS: 75.0000 mg | ORAL_CAPSULE | Freq: Two times a day (BID) | ORAL | 0 refills | Status: DC

## 2016-09-28 MED FILL — OSELTAMIVIR PHOS 75 MG CAP: 75 | 5 days supply | Qty: 10 | Fill #0

## 2016-09-28 NOTE — Progress Notes (Signed)
Patient ID: [name] MRN: [id number], DOB: 1950/12/25, 65 y.o. Date of Encounter: 09/28/2016, 11:36 AM    Chief Complaint:  Chief Complaint  Patient presents with  . Sore Throat    x3days  . Cough  . Headache     HPI: 65 y.o. year old female presents with above.   Says that symptoms started Tuesday 09/26/16. At that time felt that she just had "cold symptoms". Was "feeling cold and chills but hot at the same time.' Also had a lot of cough. Some headache. Temperature was 100. Has had some mild sore throat. Has continued with the same symptoms.     Home Meds:   Outpatient Medications Prior to Visit  Medication Sig Dispense Refill  . acetaminophen (TYLENOL) 325 MG tablet Take 2 tablets (650 mg total) by mouth every 6 (six) hours as needed for mild pain, moderate pain or fever. 60 tablet 0  . Calcium Carbonate-Vit D-Min (CALCIUM 1200 PO) Take 1,200 mg by mouth.    . cholecalciferol (VITAMIN D) 1000 UNITS tablet Take 1,000 Units by mouth daily.    . diclofenac sodium (VOLTAREN) 1 % GEL Apply 2 g topically 4 (four) times daily. 100 g 1  . meloxicam (MOBIC) 15 MG tablet Take 1 tablet (15 mg total) by mouth daily. 90 tablet 2  . NONFORMULARY OR COMPOUNDED ITEM [facility]:  Antiinflammatory Cream - Diclofenac 3%, Baclofen 2%, Lidocaine 2%, apply 1-2 grams to affected area 3-4 times daily. 480 each 11  . Omega-3 Fatty Acids (FISH OIL) 1200 MG CAPS Take 1 capsule by mouth daily.    . simvastatin (ZOCOR) 10 MG tablet TAKE 1 TABLET BY MOUTH DAILY. 90 tablet 3  . gabapentin (NEURONTIN) 300 MG capsule TAKE 1 CAPSULE BY MOUTH 3 TIMES DAILY. (Patient not taking: Reported on 09/28/2016) 90 capsule 5   No facility-administered medications prior to visit.     Allergies:  Allergies  Allergen Reactions  . Asa [Aspirin] Other (See Comments)    Stomach irritation  . Ibuprofen Other (See Comments)    Stomach irritation      Review of Systems: See HPI for pertinent ROS.  All other ROS negative.    Physical Exam: Blood pressure 100/62, pulse 88, temperature (!) 100.6 F (38.1 C), temperature source Oral, resp. rate 18, weight 145 lb (65.8 kg), SpO2 98 %., Body mass index is 25.69 kg/m. General: WNWD WF.  Appears in no acute distress. HEENT: Normocephalic, atraumatic, eyes without discharge, sclera non-icteric, nares are without discharge. Bilateral auditory canals clear, TM's are without perforation, pearly grey and translucent with reflective cone of light bilaterally. Oral cavity moist, posterior pharynx without exudate, erythema, peritonsillar abscess.  Neck: Supple. No thyromegaly. No lymphadenopathy. Lungs: Clear bilaterally to auscultation without wheezes, rales, or rhonchi. Breathing is unlabored. Heart: Regular rhythm. No murmurs, rubs, or gallops. Msk:  Strength and tone normal for age. Extremities/Skin: Warm and dry. Neuro: Alert and oriented X 3. Moves all extremities spontaneously. Gait is normal. CNII-XII grossly in tact. Psych:  Responds to questions appropriately with a normal affect.   Results for orders placed or performed in visit on 09/28/16  STREP GROUP A AG, W/REFLEX TO CULT  Result Value Ref Range   SOURCE THROAT    STREGTOCOCCUS GROUP A AG SCREEN Not Detected   Influenza A and B Ag, Immunoassay  Result Value Ref Range   Source: NASAL    Influenza A Antigen Detected (A) Not Detected   Influenza B Antigen Not  Detected Not Detected     ASSESSMENT AND PLAN:  65 y.o. year old female with  1. Influenza A She is to start the Tamiflu immediately take as directed and complete all of it. Can use over-the-counter medicines for symptom relief. If symptoms worsen significantly or are not resolving in one week then follow-up. - oseltamivir (TAMIFLU) 75 MG capsule; Take 1 capsule (75 mg total) by mouth 2 (two) times daily.  Dispense: 10 capsule; Refill: 0  2. Streptococcal sore throat - STREP GROUP A AG, W/REFLEX TO CULT  3. Body  aches - Influenza A and B Ag, Immunoassay  I asked patient if she lives alone. She states that she lives with her husband. States that he is starting to feel bad but has not developed severe cough or fever so far. States that he is a patient here as well. I have sent him in prescription for Tamiflu 75 mg twice a day as well. They're both to start the Tamiflu immediately take as directed and follow-up as directed above.  Signed, [address] [place], [place], [facility] 09/28/2016 11:36 AM                                                                                                                                                                             ,                                                                                                                                                                                                                                                          .

## 2016-09-30 LAB — CULTURE, GROUP A STREP: ORGANISM ID, BACTERIA: NORMAL

## 2016-10-02 ENCOUNTER — Telehealth: Payer: Self-pay | Admitting: *Deleted

## 2016-10-02 NOTE — Telephone Encounter (Signed)
"  I need to schedule my surgery.  I called last Monday but nobody ever called me back.  I wasn't for sure if you had gotten my message or not."  Do you have a date in mind?  He does surgery on Thursdays.  "I would like to to it in January, early morning."  I will get it scheduled.  You can register with the surgical center, instructions are in the brochure that you received.  Someone from the surgical center will call you with the arrival time a day or two before.

## 2016-10-24 MED FILL — SIMVASTATIN 10 MG TABLET: 10 | 90 days supply | Qty: 90 | Fill #1

## 2016-11-06 MED FILL — MELOXICAM 15 MG TABLET: 15 | 90 days supply | Qty: 90 | Fill #2

## 2016-11-09 ENCOUNTER — Encounter: Payer: Self-pay | Admitting: Podiatry

## 2016-11-09 ENCOUNTER — Telehealth: Payer: Self-pay | Admitting: *Deleted

## 2016-11-09 DIAGNOSIS — M25571 Pain in right ankle and joints of right foot: Secondary | ICD-10-CM | POA: Diagnosis not present

## 2016-11-09 DIAGNOSIS — M21611 Bunion of right foot: Secondary | ICD-10-CM | POA: Diagnosis not present

## 2016-11-09 DIAGNOSIS — M779 Enthesopathy, unspecified: Secondary | ICD-10-CM | POA: Diagnosis not present

## 2016-11-09 DIAGNOSIS — M2041 Other hammer toe(s) (acquired), right foot: Secondary | ICD-10-CM | POA: Diagnosis not present

## 2016-11-09 DIAGNOSIS — M19071 Primary osteoarthritis, right ankle and foot: Secondary | ICD-10-CM | POA: Diagnosis not present

## 2016-11-09 DIAGNOSIS — T8484XD Pain due to internal orthopedic prosthetic devices, implants and grafts, subsequent encounter: Secondary | ICD-10-CM | POA: Diagnosis not present

## 2016-11-09 DIAGNOSIS — M21621 Bunionette of right foot: Secondary | ICD-10-CM | POA: Diagnosis not present

## 2016-11-09 DIAGNOSIS — E7800 Pure hypercholesterolemia, unspecified: Secondary | ICD-10-CM | POA: Diagnosis not present

## 2016-11-09 DIAGNOSIS — Z4889 Encounter for other specified surgical aftercare: Secondary | ICD-10-CM | POA: Diagnosis not present

## 2016-11-09 DIAGNOSIS — Z472 Encounter for removal of internal fixation device: Secondary | ICD-10-CM | POA: Diagnosis not present

## 2016-11-09 DIAGNOSIS — M24574 Contracture, right foot: Secondary | ICD-10-CM | POA: Diagnosis not present

## 2016-11-09 DIAGNOSIS — M7751 Other enthesopathy of right foot: Secondary | ICD-10-CM | POA: Diagnosis not present

## 2016-11-09 MED FILL — OXYCODONE W/APAP 5/325 TAB: 5-325 | 5 days supply | Qty: 30 | Fill #0

## 2016-11-09 NOTE — Telephone Encounter (Signed)
Calling about the codes for [facility].  I want to make sure the CPT codes are correct.  I want to make sure only one unit is needed.  Give [place] a call back.  I tried to return her call.  It should be 4 units for Hammer Toe Repair and Capsulotomy.  Removal of hardware is just one unit but there are 4 pins in it.

## 2016-11-10 NOTE — Telephone Encounter (Signed)
I called and inform [place] that the [phone] and [phone] have 4 units each; 28110, [phone], and [phone] are only one unit each.  "Okay, I will note it in her case."  Has it been authorized?  I have an authorization number of [id number].  "No, it's still pending, that will be the number if it is authorized."

## 2016-11-13 NOTE — Progress Notes (Signed)
DOS 01.25.2018 First Metatarsophalangeal Joint Arthrodesis Right. Removal of Hardware X Right. Hammertoe Repair 2,3,4,5 Right. Metatarsal Phalangeal Joint Capsulotomy 2,3,4,5 Right. Tailors Bunionectomy Right.

## 2016-11-13 NOTE — Telephone Encounter (Signed)
[facility] sent a fax stating patient's surgery was authorized.  Authorization number is [id number].

## 2016-11-15 ENCOUNTER — Ambulatory Visit (INDEPENDENT_AMBULATORY_CARE_PROVIDER_SITE_OTHER): Payer: PPO

## 2016-11-15 ENCOUNTER — Ambulatory Visit (INDEPENDENT_AMBULATORY_CARE_PROVIDER_SITE_OTHER): Payer: Self-pay | Admitting: Podiatry

## 2016-11-15 DIAGNOSIS — M2041 Other hammer toe(s) (acquired), right foot: Secondary | ICD-10-CM

## 2016-11-15 DIAGNOSIS — M205X1 Other deformities of toe(s) (acquired), right foot: Secondary | ICD-10-CM

## 2016-11-15 MED ORDER — OXYCODONE-ACETAMINOPHEN 5-325 MG PO TABS: 1.0000 | ORAL_TABLET | ORAL | 0 refills | Status: DC | PRN

## 2016-11-15 MED FILL — OXYCODONE W/APAP 5/325 TAB: 5-325 | 5 days supply | Qty: 30 | Fill #0

## 2016-11-16 NOTE — Progress Notes (Signed)
Subjective:     Patient ID: [name], female   DOB: 1951-06-17, 66 y.o.   MRN: [id number]  HPI patient presents after having extensive forefoot surgery right by Dr. Amalia Hailey and states that she's doing okay with mild to moderate discomfort   Review of Systems     Objective:   Physical Exam Neurovascular status intact negative Homans sign noted with patient being nonweightbearing on right foot with all toes in good alignment digits well perfused pins in place wound edges well coapted with no drainage    Assessment:     Doing well post forefoot reconstruction right    Plan:     H&P x-rays reviewed with patient and advised on continued elevation immobilization compression and reapplied sterile dressing. Reappoint to recheck again by Dr. Amalia Hailey in the next 2 weeks  X-ray report indicates the osteotomy and fusion is healing well digits are in good alignment with pins in place

## 2016-11-22 ENCOUNTER — Ambulatory Visit (INDEPENDENT_AMBULATORY_CARE_PROVIDER_SITE_OTHER): Payer: PPO | Admitting: Podiatry

## 2016-11-22 DIAGNOSIS — Z9889 Other specified postprocedural states: Secondary | ICD-10-CM

## 2016-11-22 MED ORDER — GENTAMICIN SULFATE 0.1 % EX CREA: 1.0000 "application " | TOPICAL_CREAM | Freq: Three times a day (TID) | CUTANEOUS | 1 refills | Status: DC

## 2016-11-22 MED FILL — GENTAMICIN 0.1% CREAM: 0.1 | 7 days supply | Qty: 30 | Fill #0

## 2016-11-23 ENCOUNTER — Telehealth: Payer: Self-pay | Admitting: *Deleted

## 2016-11-23 NOTE — Telephone Encounter (Addendum)
FAxed verbal order from Dr. Amalia Hailey to dress right foot surgical sites with Gentamycin, cover with telfa pads, wrap with 4 x 4 gauze, wrap with kerlix and ace to [facility]. 11/24/2016-Pt states she was told she would have home health care, but no one has called. I spoke with [facility] and she said they do not accept but are trying to refer out with no luck at present. [facility] [place] [facility] states they are currently checking if they accept this [employer] plan. I informed pt I was researching home health care agencies that accepted her insurance, and was currently working with [facility]. I asked pt if she had transportation to our office for dressing change on 11/27/2016 and she said she did, I transferred to schedulers to schedule for Monday in case we were unable to get set up with a home health care agency. [place] [facility] called for pt's Medicare number. I asked pt for her Medicare number. Pt states Medicare [id number]. I called Medicare to [facility]. [facility] confirmed pt was being cared for by [facility]. 12/13/2016-[facility] - [facility] states pt doesn't want home health care due to the $25.00 co-pay each visit and pt's husband is able to due the dressing changes.

## 2016-11-25 DIAGNOSIS — Z87891 Personal history of nicotine dependence: Secondary | ICD-10-CM | POA: Diagnosis not present

## 2016-11-25 DIAGNOSIS — Z4789 Encounter for other orthopedic aftercare: Secondary | ICD-10-CM | POA: Diagnosis not present

## 2016-11-25 DIAGNOSIS — M810 Age-related osteoporosis without current pathological fracture: Secondary | ICD-10-CM | POA: Diagnosis not present

## 2016-11-25 DIAGNOSIS — R2689 Other abnormalities of gait and mobility: Secondary | ICD-10-CM | POA: Diagnosis not present

## 2016-11-25 DIAGNOSIS — Z981 Arthrodesis status: Secondary | ICD-10-CM | POA: Diagnosis not present

## 2016-11-27 ENCOUNTER — Other Ambulatory Visit: Payer: PPO

## 2016-11-27 DIAGNOSIS — M810 Age-related osteoporosis without current pathological fracture: Secondary | ICD-10-CM | POA: Diagnosis not present

## 2016-11-27 DIAGNOSIS — Z981 Arthrodesis status: Secondary | ICD-10-CM | POA: Diagnosis not present

## 2016-11-27 DIAGNOSIS — R2689 Other abnormalities of gait and mobility: Secondary | ICD-10-CM | POA: Diagnosis not present

## 2016-11-27 DIAGNOSIS — Z87891 Personal history of nicotine dependence: Secondary | ICD-10-CM | POA: Diagnosis not present

## 2016-11-27 DIAGNOSIS — Z4789 Encounter for other orthopedic aftercare: Secondary | ICD-10-CM | POA: Diagnosis not present

## 2016-12-06 ENCOUNTER — Ambulatory Visit (INDEPENDENT_AMBULATORY_CARE_PROVIDER_SITE_OTHER): Payer: PPO

## 2016-12-06 ENCOUNTER — Ambulatory Visit (INDEPENDENT_AMBULATORY_CARE_PROVIDER_SITE_OTHER): Payer: PPO | Admitting: Podiatry

## 2016-12-06 DIAGNOSIS — M2041 Other hammer toe(s) (acquired), right foot: Secondary | ICD-10-CM

## 2016-12-06 DIAGNOSIS — Z9889 Other specified postprocedural states: Secondary | ICD-10-CM

## 2016-12-06 NOTE — Progress Notes (Signed)
Patient ID: [name], female   DOB: 1951/05/24, 66 y.o.   MRN: [id number] Subjective:  Patient presents today for her postoperative visit regarding forefoot reconstructive surgery to the right foot. Patient states that she's doing well. Patient is concerned because there is a blood blister on the top foot. Date of surgery 11/09/2016.  Objective: Skin incisions are well coapted with sutures intact. There is no extensive amount of edema noted to the right foot. However there is no dehiscence noted. No sign of infectious process.  Assessment: Status post reconstructive surgery right forefoot. Date of surgery 11/09/2016.  Plan of care: Today the patient was evaluated. Dressings were changed. Orders for home health care every other 6 weeks. Prescription for gentamicin cream. Return to clinic in 2 weeks. Continue nonweightbearing until instructed otherwise.  Edrick Kins, DPM [facility]  Dr. Edrick Kins, [place]                                        [place], [place] [address]                Office [phone]  Fax [phone]

## 2016-12-06 NOTE — Addendum Note (Signed)
Addended by: Johnnye Lana A on: 12/06/2016 09:53 AM   Modules accepted: Orders

## 2016-12-06 NOTE — Progress Notes (Signed)
Patient ID: [name], female   DOB: [birth year], 66 y.o.   MRN: [id number] Subjective:  Patient presents today for her postoperative visit regarding forefoot reconstructive surgery to the right foot. Date of surgery 11/09/2016.  Objective: Skin incisions are well coapted with sutures intact. There is moderate edema noted to the right foot. However there is no dehiscence noted. No sign of infectious process.  Assessment: Status post reconstructive surgery right forefoot. Date of surgery 11/09/2016.  Plan of care: Today the patient was evaluated. Dressings were changed. Orders for home health care every other 6 weeks. Today staples and percutaneous pins were removed. Continue gentamicin cream. Continue NWB in a CAM boot. RTC 4 weeks.   Edrick Kins, DPM [facility]  Dr. Edrick Kins, [address]                                        [place], [place] [address]                Office [phone]  Fax [phone]

## 2016-12-11 DIAGNOSIS — Z981 Arthrodesis status: Secondary | ICD-10-CM | POA: Diagnosis not present

## 2016-12-11 DIAGNOSIS — M810 Age-related osteoporosis without current pathological fracture: Secondary | ICD-10-CM | POA: Diagnosis not present

## 2016-12-11 DIAGNOSIS — Z87891 Personal history of nicotine dependence: Secondary | ICD-10-CM | POA: Diagnosis not present

## 2016-12-11 DIAGNOSIS — Z4789 Encounter for other orthopedic aftercare: Secondary | ICD-10-CM | POA: Diagnosis not present

## 2016-12-11 DIAGNOSIS — R2689 Other abnormalities of gait and mobility: Secondary | ICD-10-CM | POA: Diagnosis not present

## 2016-12-13 NOTE — Telephone Encounter (Signed)
Husband can continue with dressing changes

## 2016-12-14 ENCOUNTER — Telehealth: Payer: Self-pay | Admitting: Podiatry

## 2016-12-14 NOTE — Telephone Encounter (Signed)
[name] with [facility] called because [name] no longer wants anyone to come out for her wound care. Pt said its 25.00 dollars everytime they come out and since she no longer wants them to come out. Pt. Said she and her husband can take care of the wound from now on.

## 2017-01-03 ENCOUNTER — Ambulatory Visit (INDEPENDENT_AMBULATORY_CARE_PROVIDER_SITE_OTHER): Payer: PPO

## 2017-01-03 ENCOUNTER — Ambulatory Visit (INDEPENDENT_AMBULATORY_CARE_PROVIDER_SITE_OTHER): Payer: Self-pay | Admitting: Podiatry

## 2017-01-03 DIAGNOSIS — M2041 Other hammer toe(s) (acquired), right foot: Secondary | ICD-10-CM | POA: Diagnosis not present

## 2017-01-03 DIAGNOSIS — Z9889 Other specified postprocedural states: Secondary | ICD-10-CM

## 2017-01-03 DIAGNOSIS — M205X1 Other deformities of toe(s) (acquired), right foot: Secondary | ICD-10-CM

## 2017-01-10 NOTE — Progress Notes (Signed)
Patient ID: [name], female   DOB: 10-Apr-1951, 66 y.o.   MRN: [id number] Subjective:  Patient presents today for her postoperative visit regarding forefoot reconstructive surgery to the right foot. Date of surgery 11/09/2016. Patient states that she's doing very well today and she is hoping to start walking on her foot.  Objective: Skin incisions are well coapted. There is moderate edema noted to the right foot. However there is no dehiscence noted. No sign of infectious process. Radiographic exam demonstrates intact orthopedic hardware with improvement of the arthrodesis site.  Assessment: Status post reconstructive surgery right forefoot. Date of surgery 11/09/2016 with first MPJ arthrodesis  Plan of care: Today the patient was evaluated. Patient can begin weightbearing in the immobilization cam boot for 2 weeks. Return to clinic in 2 weeks.  Edrick Kins, DPM [facility]  Dr. Edrick Kins, [place] [address]                                        [place], [place] [address]                Office [phone]  Fax [phone]

## 2017-01-17 ENCOUNTER — Encounter: Payer: Self-pay | Admitting: Podiatry

## 2017-01-17 ENCOUNTER — Ambulatory Visit (INDEPENDENT_AMBULATORY_CARE_PROVIDER_SITE_OTHER): Payer: PPO | Admitting: Podiatry

## 2017-01-17 VITALS — BP 104/72 | HR 76 | Resp 16

## 2017-01-17 DIAGNOSIS — Z9889 Other specified postprocedural states: Secondary | ICD-10-CM

## 2017-01-17 NOTE — Progress Notes (Signed)
Patient ID: [name], female   DOB: [birth year], 66 y.o.   MRN: [id number] Subjective:  Patient presents today for her postoperative visit regarding forefoot reconstructive surgery to the right foot. Date of surgery 11/09/2016. Patient states that she's doing very well today and she is hoping to begin transitioning today into a postoperative shoe out of the immobilization cam boot.  Objective: Skin incisions are well coapted. There is moderate edema noted to the right foot. However there is no dehiscence noted. No sign of infectious process. Radiographic exam demonstrates intact orthopedic hardware with improvement of the arthrodesis site.  Assessment: Status post reconstructive surgery right forefoot. Date of surgery 11/09/2016 with first MPJ arthrodesis  Plan of care: Today the patient was evaluated. Patient can begin weightbearing in a postoperative shoe today. Postoperative shoe dispensed. Return to clinic in 4 weeks for follow-up x-ray  Edrick Kins, DPM [facility]  Dr. Edrick Kins, [place]                                        [place], [place] [address]                Office [phone]  Fax [phone]

## 2017-01-18 DIAGNOSIS — D485 Neoplasm of uncertain behavior of skin: Secondary | ICD-10-CM | POA: Diagnosis not present

## 2017-01-18 DIAGNOSIS — L905 Scar conditions and fibrosis of skin: Secondary | ICD-10-CM | POA: Diagnosis not present

## 2017-01-18 DIAGNOSIS — L570 Actinic keratosis: Secondary | ICD-10-CM | POA: Diagnosis not present

## 2017-01-18 DIAGNOSIS — L720 Epidermal cyst: Secondary | ICD-10-CM | POA: Diagnosis not present

## 2017-01-19 MED FILL — SIMVASTATIN 10 MG TABLET: 10 | 90 days supply | Qty: 90 | Fill #2

## 2017-02-12 ENCOUNTER — Other Ambulatory Visit: Payer: Self-pay | Admitting: Family Medicine

## 2017-02-12 MED FILL — MELOXICAM 15 MG TABLET: 15 | 90 days supply | Qty: 90 | Fill #0

## 2017-02-14 ENCOUNTER — Ambulatory Visit (INDEPENDENT_AMBULATORY_CARE_PROVIDER_SITE_OTHER): Payer: PPO

## 2017-02-14 ENCOUNTER — Ambulatory Visit (INDEPENDENT_AMBULATORY_CARE_PROVIDER_SITE_OTHER): Payer: PPO | Admitting: Podiatry

## 2017-02-14 DIAGNOSIS — Z9889 Other specified postprocedural states: Secondary | ICD-10-CM

## 2017-02-14 NOTE — Progress Notes (Signed)
   Subjective:  Patient presents today status post right lower extremity surgery. Patient states that she's doing very well. Patient denies significant pain or swelling.    Objective/Physical Exam Skin incisions appear to be well coapted. No sign of infectious process noted. No dehiscence. No active bleeding noted. Minimal edema noted to the surgical extremity.  Radiographic Exam:  Orthopedic hardware and osteotomies sites appear to be stable. There does appear to be some delayed union of the first MPJ arthrodesis site. However orthopedic hardware appears to be intact.  Assessment: 1. s/p reconstructive surgery right forefoot with first MPJ arthrodesis. DOS: 11/09/2016   Plan of Care:  1. Patient was evaluated. X-rays reviewed 2. Patient can begin transitioning into good supportive tennis shoes. Slowly increase activity. 3. Return to clinic in 3 months   Edrick Kins, DPM [facility]  Dr. Edrick Kins, [place]                                        [place], [place] [address]                Office [phone]  Fax [phone]

## 2017-02-28 ENCOUNTER — Telehealth: Payer: Self-pay | Admitting: Podiatry

## 2017-02-28 NOTE — Telephone Encounter (Signed)
[name], I'm calling because I'm having excruciating pain in my surgery foot going up into my leg and hip. I do not know if it is related to the surgery or do I need to see my primary care physician. Date of surgery was 09 November 2016.

## 2017-02-28 NOTE — Telephone Encounter (Signed)
I spoke with Pt and informed that I felt that this far after surgery she would benefit from seeing her PCP.

## 2017-03-02 ENCOUNTER — Encounter: Payer: Self-pay | Admitting: Family Medicine

## 2017-03-02 ENCOUNTER — Ambulatory Visit
Admission: RE | Admit: 2017-03-02 | Discharge: 2017-03-02 | Disposition: A | Discharge status: Home / Self Care | Payer: PPO | Source: Ambulatory Visit | Attending: Family Medicine | Admitting: Family Medicine

## 2017-03-02 ENCOUNTER — Ambulatory Visit (INDEPENDENT_AMBULATORY_CARE_PROVIDER_SITE_OTHER): Payer: PPO | Admitting: Family Medicine

## 2017-03-02 VITALS — BP 128/80 | HR 74 | Temp 98.3°F | Resp 16 | Ht 63.0 in | Wt 156.0 lb

## 2017-03-02 DIAGNOSIS — M461 Sacroiliitis, not elsewhere classified: Secondary | ICD-10-CM | POA: Diagnosis not present

## 2017-03-02 DIAGNOSIS — M25551 Pain in right hip: Secondary | ICD-10-CM | POA: Diagnosis not present

## 2017-03-02 MED ORDER — PREDNISONE 20 MG PO TABS: ORAL_TABLET | ORAL | 0 refills | Status: DC

## 2017-03-02 MED FILL — predniSONE 20 MG TABS: 20 | 6 days supply | Qty: 12 | Fill #0

## 2017-03-02 NOTE — Progress Notes (Signed)
Subjective:    Patient ID: [name], female    DOB: [birth year], 66 y.o.   MRN: [id number]  HPI Patient presents with one-week of pain in her right posterior hip. She is tender to palpation over the right SI joint. Pain aches and throbs. It hurts worse with standing. It even hurts to lay flat on her back. She denies any radiation of the pain down her leg or into her foot. She denies any falls or injuries. She denies any fevers or chills. Recently she did have foot surgery and she has been walking awkwardly on her right foot to avoid injuring the foot while healing.pmh Past Surgical History:  Procedure Laterality Date  . ABDOMINAL HYSTERECTOMY     age 39, NO BSO  . BREAST SURGERY     biopsy x 2, benign  . Carpel tunnel     Current Outpatient Prescriptions on File Prior to Visit  Medication Sig Dispense Refill  . acetaminophen (TYLENOL) 325 MG tablet Take 2 tablets (650 mg total) by mouth every 6 (six) hours as needed for mild pain, moderate pain or fever. 60 tablet 0  . Calcium Carbonate-Vit D-Min (CALCIUM 1200 PO) Take 1,200 mg by mouth.    . cholecalciferol (VITAMIN D) 1000 UNITS tablet Take 1,000 Units by mouth daily.    . diclofenac sodium (VOLTAREN) 1 % GEL Apply 2 g topically 4 (four) times daily. 100 g 1  . meloxicam (MOBIC) 15 MG tablet TAKE 1 TABLET BY MOUTH DAILY. 90 tablet 2  . NONFORMULARY OR COMPOUNDED ITEM [facility]:  Antiinflammatory Cream - Diclofenac 3%, Baclofen 2%, Lidocaine 2%, apply 1-2 grams to affected area 3-4 times daily. 480 each 11  . Omega-3 Fatty Acids (FISH OIL) 1200 MG CAPS Take 1 capsule by mouth daily.    . simvastatin (ZOCOR) 10 MG tablet TAKE 1 TABLET BY MOUTH DAILY. 90 tablet 3   No current facility-administered medications on file prior to visit.    Allergies  Allergen Reactions  . Asa [Aspirin] Other (See Comments)    Stomach irritation  . Ibuprofen Other (See Comments)    Stomach irritation   Social History   Social History    . Marital status: Married    Spouse name: N/A  . Number of children: N/A  . Years of education: N/A   Occupational History  . Not on file.   Social History Main Topics  . Smoking status: Former Smoker    Types: Cigarettes    Quit date: 12/14/1997  . Smokeless tobacco: Never Used  . Alcohol use No  . Drug use: No  . Sexual activity: Not Currently   Other Topics Concern  . Not on file   Social History Narrative  . No narrative on file    .   Review of Systems  All other systems reviewed and are negative.      Objective:   Physical Exam  Cardiovascular: Normal rate, regular rhythm and normal heart sounds.   Pulmonary/Chest: Effort normal and breath sounds normal.  Musculoskeletal:       Right hip: Normal. She exhibits normal range of motion, normal strength, no tenderness, no bony tenderness and no crepitus.       Lumbar back: She exhibits decreased range of motion, tenderness, bony tenderness and pain. She exhibits no spasm.       Back:  Vitals reviewed.         Assessment & Plan:  Sacroiliac inflammation ([facility]) - Plan: predniSONE (DELTASONE) 20  MG tablet, DG HIP UNILAT WITH PELVIS 2-3 VIEWS RIGHT I believe the patient has SI joint inflammation. Begin prednisone taper pack. Obtain x-rays of the hip and pelvis to evaluate further. Consider SI joint injections if pain does not resolve.

## 2017-03-28 ENCOUNTER — Other Ambulatory Visit: Payer: Self-pay | Admitting: Family Medicine

## 2017-03-28 DIAGNOSIS — Z1231 Encounter for screening mammogram for malignant neoplasm of breast: Secondary | ICD-10-CM

## 2017-03-29 ENCOUNTER — Telehealth: Payer: Self-pay | Admitting: *Deleted

## 2017-03-29 DIAGNOSIS — N644 Mastodynia: Secondary | ICD-10-CM

## 2017-03-29 NOTE — Telephone Encounter (Signed)
ok 

## 2017-03-29 NOTE — Telephone Encounter (Signed)
Diagnostic mammogram and [place] of B breasts/ axilla ordered.   Call placed to patient and patient made aware per VM per DPR.

## 2017-03-29 NOTE — Telephone Encounter (Signed)
Received VM from patient.   Requested order for diagnostic mammogram at the [facility] in [place].   States that she has noted lump in L breast that is tender to touch.   Ok to order?

## 2017-04-02 ENCOUNTER — Encounter: Payer: Self-pay | Admitting: Family Medicine

## 2017-04-02 ENCOUNTER — Ambulatory Visit (INDEPENDENT_AMBULATORY_CARE_PROVIDER_SITE_OTHER): Payer: PPO | Admitting: Family Medicine

## 2017-04-02 VITALS — BP 110/70 | HR 84 | Temp 98.1°F | Ht 63.0 in | Wt 156.0 lb

## 2017-04-02 DIAGNOSIS — S76011A Strain of muscle, fascia and tendon of right hip, initial encounter: Secondary | ICD-10-CM

## 2017-04-02 MED ORDER — CYCLOBENZAPRINE HCL 10 MG PO TABS: 10.0000 mg | ORAL_TABLET | Freq: Three times a day (TID) | ORAL | 0 refills | Status: DC | PRN

## 2017-04-02 MED FILL — CYCLOBENZAPRINE 10 MG TAB: 10 | 10 days supply | Qty: 30 | Fill #0

## 2017-04-02 NOTE — Progress Notes (Signed)
Subjective:    Patient ID: [name], female    DOB: 1950/11/27, 66 y.o.   MRN: [id number]  HPI  03/02/17 Patient presents with one-week of pain in her right posterior hip. She is tender to palpation over the right SI joint. Pain aches and throbs. It hurts worse with standing. It even hurts to lay flat on her back. She denies any radiation of the pain down her leg or into her foot. She denies any falls or injuries. She denies any fevers or chills. Recently she did have foot surgery and she has been walking awkwardly on her right foot to avoid injuring the foot while healing.  At that time, my plan was: I believe the patient has SI joint inflammation. Begin prednisone taper pack. Obtain x-rays of the hip and pelvis to evaluate further. Consider SI joint injections if pain does not resolve.  04/02/17 Sprays were unremarkable. Pain subsided in 3 days on prednisone. Last Thursday, the patient injured her left hip. She's not sure how she did it but the pain gradually worsened throughout the day. She felt no pop. She felt no crack. The pain is severe. It hurts for me to palpate the left hip joint anteriorly. It hurts with internal and start rotation. It hurts worse with flexion. She is unable to flex the hip on her own without severe pain against gravity.  Past Medical History:  Diagnosis Date  . Hyperlipidemia   . Osteopenia     Past Surgical History:  Procedure Laterality Date  . ABDOMINAL HYSTERECTOMY     age 53, NO BSO  . BREAST SURGERY     biopsy x 2, benign  . Carpel tunnel     Current Outpatient Prescriptions on File Prior to Visit  Medication Sig Dispense Refill  . acetaminophen (TYLENOL) 325 MG tablet Take 2 tablets (650 mg total) by mouth every 6 (six) hours as needed for mild pain, moderate pain or fever. 60 tablet 0  . Calcium Carbonate-Vit D-Min (CALCIUM 1200 PO) Take 1,200 mg by mouth.    . cholecalciferol (VITAMIN D) 1000 UNITS tablet Take 1,000 Units by mouth daily.      . diclofenac sodium (VOLTAREN) 1 % GEL Apply 2 g topically 4 (four) times daily. 100 g 1  . meloxicam (MOBIC) 15 MG tablet TAKE 1 TABLET BY MOUTH DAILY. 90 tablet 2  . NONFORMULARY OR COMPOUNDED ITEM [facility]:  Antiinflammatory Cream - Diclofenac 3%, Baclofen 2%, Lidocaine 2%, apply 1-2 grams to affected area 3-4 times daily. 480 each 11  . Omega-3 Fatty Acids (FISH OIL) 1200 MG CAPS Take 1 capsule by mouth daily.    . simvastatin (ZOCOR) 10 MG tablet TAKE 1 TABLET BY MOUTH DAILY. 90 tablet 3   No current facility-administered medications on file prior to visit.    Allergies  Allergen Reactions  . Asa [Aspirin] Other (See Comments)    Stomach irritation  . Ibuprofen Other (See Comments)    Stomach irritation   Social History   Social History  . Marital status: Married    Spouse name: N/A  . Number of children: N/A  . Years of education: N/A   Occupational History  . Not on file.   Social History Main Topics  . Smoking status: Former Smoker    Types: Cigarettes    Quit date: 12/14/1997  . Smokeless tobacco: Never Used  . Alcohol use No  . Drug use: No  . Sexual activity: Not Currently   Other Topics  Concern  . Not on file   Social History Narrative  . No narrative on file    .   Review of Systems  All other systems reviewed and are negative.      Objective:   Physical Exam  Cardiovascular: Normal rate, regular rhythm and normal heart sounds.   Pulmonary/Chest: Effort normal and breath sounds normal.  Musculoskeletal:       Right hip: Normal. She exhibits normal range of motion, normal strength, no tenderness, no bony tenderness and no crepitus.       Left hip: She exhibits decreased range of motion, decreased strength, tenderness and bony tenderness. She exhibits no crepitus and no deformity.  Vitals reviewed.         Assessment & Plan:  Hip strain, right, initial encounter  I believe the patient strained a muscle in her hip. The previous  x-ray of the pelvis showed no significant pathology in the left hip. I will treat the patient with Flexeril 10 mg every 8 hours as needed and rest. Reassess in one week. If worsening, would repeat an x-ray of the left hip even the original x-rays were unremarkable of the pelvis and also consult orthopedics for possible intra-articular cortisone injection versus physical therapy

## 2017-04-11 ENCOUNTER — Other Ambulatory Visit: Payer: Self-pay | Admitting: Family Medicine

## 2017-04-11 DIAGNOSIS — N644 Mastodynia: Secondary | ICD-10-CM | POA: Insufficient documentation

## 2017-04-11 NOTE — Progress Notes (Unsigned)
[facility] phoned to request order be changed from bilateral mammo to left breast only. Order put in.

## 2017-04-13 ENCOUNTER — Ambulatory Visit (INDEPENDENT_AMBULATORY_CARE_PROVIDER_SITE_OTHER): Payer: PPO | Admitting: Orthopedic Surgery

## 2017-04-13 ENCOUNTER — Ambulatory Visit (INDEPENDENT_AMBULATORY_CARE_PROVIDER_SITE_OTHER): Payer: PPO

## 2017-04-13 DIAGNOSIS — S32592A Other specified fracture of left pubis, initial encounter for closed fracture: Secondary | ICD-10-CM | POA: Diagnosis not present

## 2017-04-13 DIAGNOSIS — M25552 Pain in left hip: Secondary | ICD-10-CM

## 2017-04-13 NOTE — Progress Notes (Signed)
Office Visit Note   Patient: [name]           Date of Birth: 12-09-1950           MRN: [id number] Visit Date: 04/13/2017              Requested by: Susy Frizzle, MD [address] [place],  [address] PCP: Susy Frizzle, MD  No chief complaint on file.     HPI: Patient is a 66 year old woman who states he's had a two-week history of left groin pain which now is radiating down to the anterior aspect of her thigh. Patient states that she was moving some items in her closet but did not have any acute onset of pain. Patient states that she has been immobilized for months secondary to foot surgery. Patient is currently ambulating with a walker which she states makes it feel better. Patient states she does have osteoporosis she is on vitamin D3 1000 units per day and calcium 1200 mg per day. She is also status post sciatic symptoms on the right lower extremity which was treated with prednisone.  Assessment & Plan: Visit Diagnoses:  1. Pain in left hip   2. Pubic ramus fracture, left, closed, initial encounter ([place])     Plan: Recommend she continue the walker and follow up in 4 weeks at which time we'll repeat AP pelvic x-ray. Patient states she does have a follow-up physical in July and recommended her getting a D3 level drawn to see if she needs increase her vitamin D3. Patient may need to consider other medications for her osteoporosis.  Follow-Up Instructions: Return in about 4 weeks (around 05/11/2017).   Ortho Exam  Patient is alert, oriented, no adenopathy, well-dressed, normal affect, normal respiratory effort. Examination patient ambulates with a walker. She has pain with internal and external rotation of the left hip she has no sciatic tension sign no focal motor weakness. She is tender to palpation on the left pubic rami's.  Imaging: Xr Hip Unilat W Or W/o Pelvis 2-3 Views Left  Result Date: 04/13/2017 2 view radiographs of the left hip shows no  arthritic changes of the left hip. Radiographs do show a fracture through the superior and inferior pubic rami on the left. There are no bony lytic changes no evidence metastatic disease.   Labs: Lab Results  Component Value Date   LABORGA  09/28/2016    Normal Upper Respiratory Flora No Beta Hemolytic Streptococci Isolated     Orders:  Orders Placed This Encounter  Procedures  . XR HIP UNILAT W OR W/O PELVIS 2-3 VIEWS LEFT   No orders of the defined types were placed in this encounter.    Procedures: No procedures performed  Clinical Data: No additional findings.  ROS:  All other systems negative, except as noted in the HPI. Review of Systems  Objective: Vital Signs: There were no vitals taken for this visit.  Specialty Comments:  No specialty comments available.  PMFS History: Patient Active Problem List   Diagnosis Date Noted  . Breast pain, left 04/11/2017  . Osteoporosis 05/24/2015  . Osteopenia   . Hyperlipidemia    Past Medical History:  Diagnosis Date  . Hyperlipidemia   . Osteopenia     Family History  Problem Relation Age of Onset  . Depression Mother   . Cancer Mother        breast s/p mastectomy  . Alzheimer's disease Father   . Early death  Maternal Grandfather [place] Accident    Past Surgical History:  Procedure Laterality Date  . ABDOMINAL HYSTERECTOMY     age 74, NO BSO  . BREAST SURGERY     biopsy x 2, benign  . Carpel tunnel     Social History   Occupational History  . Not on file.   Social History Main Topics  . Smoking status: Former Smoker    Types: Cigarettes    Quit date: 12/14/1997  . Smokeless tobacco: Never Used  . Alcohol use No  . Drug use: No  . Sexual activity: Not Currently

## 2017-04-16 ENCOUNTER — Other Ambulatory Visit: Payer: PPO

## 2017-04-16 DIAGNOSIS — M810 Age-related osteoporosis without current pathological fracture: Secondary | ICD-10-CM | POA: Diagnosis not present

## 2017-04-16 DIAGNOSIS — E559 Vitamin D deficiency, unspecified: Secondary | ICD-10-CM

## 2017-04-17 ENCOUNTER — Encounter: Payer: Self-pay | Admitting: Family Medicine

## 2017-04-17 LAB — VITAMIN D 25 HYDROXY (VIT D DEFICIENCY, FRACTURES): Vit D, 25-Hydroxy: 49 ng/mL (ref 30–100)

## 2017-04-19 MED FILL — SIMVASTATIN 10 MG TABLET: 10 | 90 days supply | Qty: 90 | Fill #3

## 2017-04-20 ENCOUNTER — Ambulatory Visit
Admission: RE | Admit: 2017-04-20 | Discharge: 2017-04-20 | Disposition: A | Discharge status: Home / Self Care | Payer: PPO | Source: Ambulatory Visit | Attending: Family Medicine | Admitting: Family Medicine

## 2017-04-20 DIAGNOSIS — R928 Other abnormal and inconclusive findings on diagnostic imaging of breast: Secondary | ICD-10-CM | POA: Diagnosis not present

## 2017-04-20 DIAGNOSIS — N6321 Unspecified lump in the left breast, upper outer quadrant: Secondary | ICD-10-CM | POA: Diagnosis not present

## 2017-04-20 DIAGNOSIS — N644 Mastodynia: Secondary | ICD-10-CM

## 2017-05-08 DIAGNOSIS — H5203 Hypermetropia, bilateral: Secondary | ICD-10-CM | POA: Diagnosis not present

## 2017-05-11 ENCOUNTER — Ambulatory Visit (INDEPENDENT_AMBULATORY_CARE_PROVIDER_SITE_OTHER): Payer: PPO | Admitting: Orthopedic Surgery

## 2017-05-11 ENCOUNTER — Encounter (INDEPENDENT_AMBULATORY_CARE_PROVIDER_SITE_OTHER): Payer: Self-pay | Admitting: Family

## 2017-05-11 ENCOUNTER — Ambulatory Visit (INDEPENDENT_AMBULATORY_CARE_PROVIDER_SITE_OTHER): Payer: PPO | Admitting: Family

## 2017-05-11 ENCOUNTER — Ambulatory Visit (INDEPENDENT_AMBULATORY_CARE_PROVIDER_SITE_OTHER): Payer: PPO

## 2017-05-11 VITALS — Ht 63.0 in | Wt 156.0 lb

## 2017-05-11 DIAGNOSIS — S32592D Other specified fracture of left pubis, subsequent encounter for fracture with routine healing: Secondary | ICD-10-CM | POA: Diagnosis not present

## 2017-05-11 DIAGNOSIS — M25552 Pain in left hip: Secondary | ICD-10-CM

## 2017-05-11 DIAGNOSIS — M79652 Pain in left thigh: Secondary | ICD-10-CM

## 2017-05-14 NOTE — Progress Notes (Signed)
Office Visit Note   Patient: [name]           Date of Birth: 08/15/1951           MRN: [id number] Visit Date: 05/11/2017              Requested by: Susy Frizzle, MD [address] [place], [place] [address] PCP: Susy Frizzle, MD  Chief Complaint  Patient presents with  . Left Hip - Follow-up    Pubic ramus fracture.       HPI: The patient is a 66 year old woman who presents today in follow-up for a left pubic rami fracture. Roll feel she is doing much better she's been taking Tylenol as needed for pain. The pain is not completely gone but she is able to ambulate and is getting back to her regular activities slowly. She has been having throbbing pain in her mid femur for the past 3 days. Increased pain with ambulation and feels that the bone pain does not feel is muscular. No known injury no ecchymosis or erythema.  Known osteoporosis. Last DEXA 2 years ago.  Assessment & Plan: Visit Diagnoses:  1. Closed fracture of inferior pubic ramus, left, with routine healing, subsequent encounter   2. Pain in left hip   3. Pain of left thigh     Plan: continue with Tylenol prn. Follow up in office as needed. If thigh/ femur pain does not resolve will follow up in office, may consider advance imaging. Will have PCP visit in 2 weeks, plans to discuss osteoporosis and possible therapies at visit.   Follow-Up Instructions: Return in about 4 weeks (around 06/08/2017), or if symptoms worsen or fail to improve.   Left Hip Exam  Left hip exam is normal.      Patient is alert, oriented, no adenopathy, well-dressed, normal affect, normal respiratory effort. Mid femur tender to deep palpation. No erythema or edema.   Imaging: No results found.  Labs: Lab Results  Component Value Date   LABORGA  09/28/2016    Normal Upper Respiratory Flora No Beta Hemolytic Streptococci Isolated     Orders:  Orders Placed This Encounter  Procedures  . XR Pelvis 1-2 Views   . XR FEMUR MIN 2 VIEWS LEFT   No orders of the defined types were placed in this encounter.    Procedures: No procedures performed  Clinical Data: No additional findings.  ROS:  All other systems negative, except as noted in the HPI. Review of Systems  Constitutional: Negative for chills and fever.  Cardiovascular: Negative for leg swelling.  Genitourinary: Positive for pelvic pain.  Musculoskeletal: Positive for arthralgias. Negative for joint swelling and myalgias.  Skin: Negative for color change.  Neurological: Negative for weakness.    Objective: Vital Signs: Ht 5\' 3"  (1.6 m)   Wt 156 lb (70.8 kg)   BMI 27.63 kg/m   Specialty Comments:  No specialty comments available.  PMFS History: Patient Active Problem List   Diagnosis Date Noted  . Breast pain, left 04/11/2017  . Osteoporosis 05/24/2015  . Osteopenia   . Hyperlipidemia    Past Medical History:  Diagnosis Date  . Hyperlipidemia   . Osteopenia     Family History  Problem Relation Age of Onset  . Depression Mother   . Cancer Mother        breast s/p mastectomy  . Breast cancer Mother   . Alzheimer's disease Father   . Early death Maternal  Grandfather [place]    Past Surgical History:  Procedure Laterality Date  . ABDOMINAL HYSTERECTOMY     age 29, NO BSO  . BREAST BIOPSY Bilateral 1996   benign  . BREAST SURGERY     biopsy x 2, benign  . Carpel tunnel     Social History   Occupational History  . Not on file.   Social History Main Topics  . Smoking status: Former Smoker    Types: Cigarettes    Quit date: 12/14/1997  . Smokeless tobacco: Never Used  . Alcohol use No  . Drug use: No  . Sexual activity: Not Currently

## 2017-05-21 ENCOUNTER — Ambulatory Visit (INDEPENDENT_AMBULATORY_CARE_PROVIDER_SITE_OTHER): Payer: PPO

## 2017-05-21 ENCOUNTER — Encounter: Payer: Self-pay | Admitting: Podiatry

## 2017-05-21 ENCOUNTER — Ambulatory Visit (INDEPENDENT_AMBULATORY_CARE_PROVIDER_SITE_OTHER): Payer: PPO | Admitting: Podiatry

## 2017-05-21 DIAGNOSIS — M7751 Other enthesopathy of right foot: Secondary | ICD-10-CM

## 2017-05-21 DIAGNOSIS — M778 Other enthesopathies, not elsewhere classified: Secondary | ICD-10-CM

## 2017-05-21 DIAGNOSIS — Z9889 Other specified postprocedural states: Secondary | ICD-10-CM | POA: Diagnosis not present

## 2017-05-21 DIAGNOSIS — M779 Enthesopathy, unspecified: Secondary | ICD-10-CM

## 2017-05-21 NOTE — Progress Notes (Signed)
   HPI: Patient presents today for follow-up evaluation status post reconstructive surgery of the right forefoot with first MPJ arthrodesis. Date of surgery 11/09/2016. Patient is doing much better. She does have some residual pain regarding the lesser toes of the right foot during prolonged periods of standing. Patient also recently broke her pubic bone to the left lower extremity. She states that she is putting excessive pressure on her right foot which is contributing to the painful symptoms   Physical Exam: General: The patient is alert and oriented x3 in no acute distress.  Dermatology: Skin is warm, dry and supple bilateral lower extremities. Negative for open lesions or macerations.  Vascular: Palpable pedal pulses bilaterally. No edema or erythema noted. Capillary refill within normal limits.  Neurological: Epicritic and protective threshold grossly intact bilaterally.   Musculoskeletal Exam: Range of motion within normal limits to all pedal and ankle joints bilateral with exception of the first MPJ arthrodesis right foot which is stable without any motion noted. Muscle strength 5/5 in all groups bilateral.   Radiographic Exam:  Normal osseous mineralization. Surgical sites with orthopedic hardware appears stable and intact. No fracture visualized.  Assessment: 1. Status post reconstructive surgery right forefoot with first MTPJ arthrodesis. Date of surgery 11/09/2016   Plan of Care:  1. Patient was evaluated. X-rays reviewed today 2. The patient has a pair of old custom molded orthotics that she had made at one point. Recommend that she resume wearing her custom molded orthotics. If the orthotics are uncomfortable for did not help she may be a new pair of orthotics. 3. If The patient would like a new pair of orthotics he will call for an appointment with Liliane Channel, Pedorthist 4. Return to clinic when necessary    Edrick Kins, DPM [facility]  Dr. Edrick Kins,  DPM    [address], [place] [address]                Office [phone]  Fax [phone]

## 2017-05-31 ENCOUNTER — Ambulatory Visit (INDEPENDENT_AMBULATORY_CARE_PROVIDER_SITE_OTHER): Payer: PPO | Admitting: Orthopedic Surgery

## 2017-05-31 ENCOUNTER — Ambulatory Visit (INDEPENDENT_AMBULATORY_CARE_PROVIDER_SITE_OTHER): Payer: PPO

## 2017-05-31 ENCOUNTER — Encounter (INDEPENDENT_AMBULATORY_CARE_PROVIDER_SITE_OTHER): Payer: Self-pay | Admitting: Orthopedic Surgery

## 2017-05-31 DIAGNOSIS — M545 Low back pain: Secondary | ICD-10-CM

## 2017-05-31 DIAGNOSIS — S32592D Other specified fracture of left pubis, subsequent encounter for fracture with routine healing: Secondary | ICD-10-CM

## 2017-05-31 MED ORDER — PREDNISONE 10 MG PO TABS: 10.0000 mg | ORAL_TABLET | Freq: Every day | ORAL | 0 refills | Status: DC

## 2017-05-31 MED FILL — predniSONE 10 MG TABS: 10 | 30 days supply | Qty: 60 | Fill #0

## 2017-05-31 NOTE — Progress Notes (Signed)
Office Visit Note   Patient: [name]           Date of Birth: [birth year]           MRN: [id number] Visit Date: 05/31/2017              Requested by: Susy Frizzle, MD [address] [place], [place] [address] PCP: Susy Frizzle, MD  Chief Complaint  Patient presents with  . Lower Back - Pain, Follow-up      HPI: Patient is a 66 year old woman who is status post pubic rami fracture on the left without trauma. Patient states that her D3 level is 49 and she has not been rescheduled for bone density study. Patient states that the groin pain is improving but she states she now has pain which she describes as 10 over 10 over the sacroiliac joint on the left. She states she has pain with getting up from a sitting position states that she limps for ambulation. States that she has had radicular pain in the past and this pain is pre-much focalized over the SI joint and does not radiate.  Assessment & Plan: Visit Diagnoses:  1. Acute bilateral low back pain, with sciatica presence unspecified   2. Closed fracture of inferior pubic ramus, left, with routine healing, subsequent encounter     Plan: We'll place her on a prednisone Dosepak she will start with 10 mg every morning and may increase to 20 mg as needed she will wean off this once the symptoms resolved. Discussed the possibility of obtaining a CT scan to further identify the sacroiliac joint pathology patient states that she does not want to proceed with the CT scan at this time since the treatment would remain the same.  Follow-Up Instructions: Return in about 3 weeks (around 06/21/2017).   Ortho Exam  Patient is alert, oriented, no adenopathy, well-dressed, normal affect, normal respiratory effort. Examination patient has pain getting from a sitting to a standing position she does have an antalgic gait. She has no hip pain with internal and external rotation of the left hip. She does reproduce pain over the  sacroiliac joint with straight leg raise. Patient has good motor strength in the left lower extremity with plantarflexion and dorsiflexion. She has point tender to palpation over the sacroiliac joint. Her MRI scan was also reviewed from September 2017 and this showed very mild bulging disc at L4-L5 but no significant lumbar spine pathology.  Imaging: Xr Lumbar Spine 2-3 Views  Result Date: 05/31/2017 2 view radiographs of the lumbar spine shows calcification of the aorta maximum diameter 23 mm. There is stable alignment of the pubic rami fracture on the left no evidence of a femoral neck fracture or intertrochanteric hip fracture. The SI joint is aligned without displacement.  No images are attached to the encounter.  Labs: Lab Results  Component Value Date   LABORGA  09/28/2016    Normal Upper Respiratory Flora No Beta Hemolytic Streptococci Isolated     Orders:  Orders Placed This Encounter  Procedures  . XR Lumbar Spine 2-3 Views   Meds ordered this encounter  Medications  . predniSONE (DELTASONE) 10 MG tablet    Sig: Take 1 tablet (10 mg total) by mouth daily with breakfast. May increase to 2 tablets daily    Dispense:  60 tablet    Refill:  0     Procedures: No procedures performed  Clinical Data: No additional findings.  ROS:  All other systems negative, except as noted in the HPI. Review of Systems  Objective: Vital Signs: There were no vitals taken for this visit.  Specialty Comments:  No specialty comments available.  PMFS History: Patient Active Problem List   Diagnosis Date Noted  . Breast pain, left 04/11/2017  . Osteoporosis 05/24/2015  . Osteopenia   . Hyperlipidemia    Past Medical History:  Diagnosis Date  . Hyperlipidemia   . Osteopenia     Family History  Problem Relation Age of Onset  . Depression Mother   . Cancer Mother        breast s/p mastectomy  . Breast cancer Mother   . Alzheimer's disease Father   . Early death Maternal  Grandfather 80       [place] Accident    Past Surgical History:  Procedure Laterality Date  . ABDOMINAL HYSTERECTOMY     age 15, NO BSO  . BREAST BIOPSY Bilateral 1996   benign  . BREAST SURGERY     biopsy x 2, benign  . Carpel tunnel     Social History   Occupational History  . Not on file.   Social History Main Topics  . Smoking status: Former Smoker    Types: Cigarettes    Quit date: 12/14/1997  . Smokeless tobacco: Never Used  . Alcohol use No  . Drug use: No  . Sexual activity: Not Currently

## 2017-06-19 ENCOUNTER — Telehealth: Payer: Self-pay | Admitting: *Deleted

## 2017-06-19 ENCOUNTER — Ambulatory Visit (INDEPENDENT_AMBULATORY_CARE_PROVIDER_SITE_OTHER): Payer: PPO | Admitting: Family Medicine

## 2017-06-19 ENCOUNTER — Encounter: Payer: Self-pay | Admitting: Family Medicine

## 2017-06-19 ENCOUNTER — Encounter: Payer: Self-pay | Admitting: *Deleted

## 2017-06-19 VITALS — BP 112/68 | HR 82 | Temp 98.0°F | Resp 14 | Ht 63.0 in | Wt 158.0 lb

## 2017-06-19 DIAGNOSIS — M810 Age-related osteoporosis without current pathological fracture: Secondary | ICD-10-CM | POA: Diagnosis not present

## 2017-06-19 DIAGNOSIS — Z23 Encounter for immunization: Secondary | ICD-10-CM

## 2017-06-19 DIAGNOSIS — Z1159 Encounter for screening for other viral diseases: Secondary | ICD-10-CM | POA: Diagnosis not present

## 2017-06-19 DIAGNOSIS — E7800 Pure hypercholesterolemia, unspecified: Secondary | ICD-10-CM | POA: Diagnosis not present

## 2017-06-19 DIAGNOSIS — Z0000 Encounter for general adult medical examination without abnormal findings: Secondary | ICD-10-CM

## 2017-06-19 LAB — CBC WITH DIFFERENTIAL/PLATELET
Basophils Absolute: 0 cells/uL (ref 0–200)
Basophils Relative: 0 %
EOS PCT: 0 %
Eosinophils Absolute: 0 cells/uL — ABNORMAL LOW (ref 15–500)
HCT: 39.5 % (ref 35.0–45.0)
Hemoglobin: 12.8 g/dL (ref 12.0–15.0)
LYMPHS ABS: 2079 {cells}/uL (ref 850–3900)
LYMPHS PCT: 27 %
MCH: 31.1 pg (ref 27.0–33.0)
MCHC: 32.4 g/dL (ref 32.0–36.0)
MCV: 95.9 fL (ref 80.0–100.0)
MONOS PCT: 8 %
MPV: 9.7 fL (ref 7.5–12.5)
Monocytes Absolute: 616 cells/uL (ref 200–950)
NEUTROS PCT: 65 %
Neutro Abs: 5005 cells/uL (ref 1500–7800)
PLATELETS: 388 10*3/uL (ref 140–400)
RBC: 4.12 MIL/uL (ref 3.80–5.10)
RDW: 13.2 % (ref 11.0–15.0)
WBC: 7.7 10*3/uL (ref 3.8–10.8)

## 2017-06-19 NOTE — Progress Notes (Signed)
Subjective:    Patient ID: [name], female    DOB: 1951-05-31, 66 y.o.   MRN: [id number]  HPI Patient is a very pleasant 67 year old white female who is here today for complete physical exam. Her mammogram is due but she states that she would like to schedule this on her own. She does not require Pap smears because of her history of a hysterectomy. She had osteoporosis on DEXA 2016 and is due for repeat in 2018.  However the patient declined treatment for osteoporosis in the past. However over the last year, she suffered a stress fracture to the pubic rami as well as to the sacrum. She is now interested in treatment for osteoporosis. She is on calcium and vit D. she is due for a flu shot. Her pneumonia vaccine is up-to-date. She is also due for hepatitis C screening Past Medical History:  Diagnosis Date  . Hyperlipidemia   . Osteopenia    Past Surgical History:  Procedure Laterality Date  . ABDOMINAL HYSTERECTOMY     age 66, NO BSO  . BREAST BIOPSY Bilateral 1996   benign  . BREAST SURGERY     biopsy x 2, benign  . Carpel tunnel     Current Outpatient Prescriptions on File Prior to Visit  Medication Sig Dispense Refill  . acetaminophen (TYLENOL) 325 MG tablet Take 2 tablets (650 mg total) by mouth every 6 (six) hours as needed for mild pain, moderate pain or fever. 60 tablet 0  . Calcium Carbonate-Vit D-Min (CALCIUM 1200 PO) Take 1,200 mg by mouth.    . cholecalciferol (VITAMIN D) 1000 UNITS tablet Take 1,000 Units by mouth daily.    . diclofenac sodium (VOLTAREN) 1 % GEL Apply 2 g topically 4 (four) times daily. 100 g 1  . NONFORMULARY OR COMPOUNDED ITEM [facility]:  Antiinflammatory Cream - Diclofenac 3%, Baclofen 2%, Lidocaine 2%, apply 1-2 grams to affected area 3-4 times daily. 480 each 11  . Omega-3 Fatty Acids (FISH OIL) 1200 MG CAPS Take 1 capsule by mouth daily.    . predniSONE (DELTASONE) 10 MG tablet Take 1 tablet (10 mg total) by mouth daily with breakfast.  May increase to 2 tablets daily 60 tablet 0  . simvastatin (ZOCOR) 10 MG tablet TAKE 1 TABLET BY MOUTH DAILY. 90 tablet 3   No current facility-administered medications on file prior to visit.    Allergies  Allergen Reactions  . Asa [Aspirin] Other (See Comments)    Stomach irritation  . Ibuprofen Other (See Comments)    Stomach irritation   Social History   Social History  . Marital status: Married    Spouse name: N/A  . Number of children: N/A  . Years of education: N/A   Occupational History  . Not on file.   Social History Main Topics  . Smoking status: Former Smoker    Types: Cigarettes    Quit date: 12/14/1997  . Smokeless tobacco: Never Used  . Alcohol use No  . Drug use: No  . Sexual activity: Not Currently   Other Topics Concern  . Not on file   Social History Narrative  . No narrative on file   Family History  Problem Relation Age of Onset  . Depression Mother   . Cancer Mother        breast s/p mastectomy  . Breast cancer Mother   . Alzheimer's disease Father   . Early death Maternal Grandfather 53  [place] Accident     Review of Systems  All other systems reviewed and are negative.      Objective:   Physical Exam  Constitutional: She is oriented to person, place, and time. She appears well-developed and well-nourished. No distress.  HENT:  Head: Normocephalic and atraumatic.  Right Ear: External ear normal.  Left Ear: External ear normal.  Nose: Nose normal.  Mouth/Throat: Oropharynx is clear and moist. No oropharyngeal exudate.  Eyes: Pupils are equal, round, and reactive to light. Conjunctivae and EOM are normal. Right eye exhibits no discharge. Left eye exhibits no discharge. No scleral icterus.  Neck: Normal range of motion. Neck supple. No JVD present. No tracheal deviation present. No thyromegaly present.  Cardiovascular: Normal rate, regular rhythm, normal heart sounds and intact distal pulses.  Exam reveals no gallop and no  friction rub.   No murmur heard. Pulmonary/Chest: Effort normal and breath sounds normal. No stridor. No respiratory distress. She has no wheezes. She has no rales. She exhibits no tenderness.  Abdominal: Soft. Bowel sounds are normal. She exhibits no distension and no mass. There is no tenderness. There is no rebound and no guarding.  Musculoskeletal: Normal range of motion. She exhibits no edema or tenderness.  Lymphadenopathy:    She has no cervical adenopathy.  Neurological: She is alert and oriented to person, place, and time. She has normal reflexes. No cranial nerve deficit. She exhibits normal muscle tone. Coordination normal.  Skin: Skin is warm. No rash noted. She is not diaphoretic. No erythema. No pallor.  Psychiatric: She has a normal mood and affect. Her behavior is normal. Judgment and thought content normal.  Vitals reviewed.         Assessment & Plan:  Routine general medical examination at a health care facility - Plan: CBC with Differential/Platelet, COMPLETE METABOLIC PANEL WITH GFR, Lipid panel, Hepatitis C Ab Reflex HCV RNA, QUANT  Pure hypercholesterolemia - Plan: CBC with Differential/Platelet, COMPLETE METABOLIC PANEL WITH GFR, Lipid panel  Osteoporosis, unspecified osteoporosis type, unspecified pathological fracture presence  Encounter for hepatitis C screening test for low risk patient - Plan: Hepatitis C Ab Reflex HCV RNA, QUANT  Need for immunization against influenza - Plan: Flu Vaccine QUAD 36+ mos IM  Physical exam is completely normal. I will schedule the patient for prolia q 6 months for osteoporosis. I recommended 1200 mg of calcium and 1000 units a day of vitamin D. She received her flu shot today. She declines a colonoscopy but she will consent to cologuard.  Pap smear is not required. She will schedule her mammogram. I will check a CBC, CMP, fasting lipid panel. I will also screen the patient for hepatitis C

## 2017-06-19 NOTE — Telephone Encounter (Signed)
-----   Message from Susy Frizzle, MD sent at 06/19/2017  1:56 PM EDT ----- Yes, its an annual exam. ----- Message ----- From: Sheral Flow, LPN Sent: 11/16/3084  57:84 AM To: Susy Frizzle, MD  Patient had negative cologuard on 06/22/2016. Do you want to reorder for this year? ----- Message ----- From: Susy Frizzle, MD Sent: 06/19/2017  10:04 AM To: Eden Lathe Stephaun Million, LPN  Needs cologuard scheduled.

## 2017-06-19 NOTE — Telephone Encounter (Signed)
Order [id number] has been submitted via [employer].

## 2017-06-20 LAB — LIPID PANEL
Cholesterol: 198 mg/dL (ref <200)
HDL: 71 mg/dL (ref >50)
LDL Cholesterol: 87 mg/dL (ref <100)
Total CHOL/HDL Ratio: 2.8 Ratio (ref <5.0)
Triglycerides: 199 mg/dL — ABNORMAL HIGH (ref <150)
VLDL: 40 mg/dL — AB (ref <30)

## 2017-06-20 LAB — COMPLETE METABOLIC PANEL WITH GFR
ALT: 14 U/L (ref 6–29)
AST: 16 U/L (ref 10–35)
Albumin: 4.6 g/dL (ref 3.6–5.1)
Alkaline Phosphatase: 93 U/L (ref 33–130)
BUN: 10 mg/dL (ref 7–25)
CHLORIDE: 99 mmol/L (ref 98–110)
CO2: 23 mmol/L (ref 20–32)
Calcium: 9.9 mg/dL (ref 8.6–10.4)
Creat: 0.64 mg/dL (ref 0.50–0.99)
GFR, Est African American: >89 mL/min (ref >=60)
GLUCOSE: 105 mg/dL — AB (ref 70–99)
POTASSIUM: 4.6 mmol/L (ref 3.5–5.3)
SODIUM: 139 mmol/L (ref 135–146)
Total Bilirubin: 0.6 mg/dL (ref 0.2–1.2)
Total Protein: 7.3 g/dL (ref 6.1–8.1)

## 2017-06-20 LAB — HEPATITIS C ANTIBODY: HCV AB: NONREACTIVE

## 2017-06-21 ENCOUNTER — Encounter (INDEPENDENT_AMBULATORY_CARE_PROVIDER_SITE_OTHER): Payer: Self-pay | Admitting: Orthopedic Surgery

## 2017-06-21 ENCOUNTER — Ambulatory Visit (INDEPENDENT_AMBULATORY_CARE_PROVIDER_SITE_OTHER): Payer: PPO | Admitting: Orthopedic Surgery

## 2017-06-21 DIAGNOSIS — S32592D Other specified fracture of left pubis, subsequent encounter for fracture with routine healing: Secondary | ICD-10-CM | POA: Diagnosis not present

## 2017-06-21 NOTE — Progress Notes (Signed)
Office Visit Note   Patient: [name]           Date of Birth: [birth year]           MRN: [id number] Visit Date: 06/21/2017              Requested by: Susy Frizzle, MD [address] [place], [place] [address] PCP: Susy Frizzle, MD  Chief Complaint  Patient presents with  . Follow up    Lt femur      HPI: Patient is a 66 year old woman who is status post pubic ramus fracture on the left without trauma. Patient states that her D3 level is 49, last bone density study in 2016. Is to start Prolia next week.   Patient states that the groin, thigh and SI pain are about 85% better with Prednisone, did take 20 mg for first week. Now taking 10 mg. Wonders if should stop this and when. Able to drive and do grocery shopping, overall pleased with improvement in symptoms.   Assessment & Plan: Visit Diagnoses:  1. Closed fracture of ramus of left pubis with routine healing, subsequent encounter     Plan: She'll stop the prednisone when her Rx runs out. Follow up in office as needed.  Follow-Up Instructions: Return if symptoms worsen or fail to improve.   Ortho Exam  Patient is alert, oriented, no adenopathy, well-dressed, normal affect, normal respiratory effort. Steady gait. She has no hip pain with internal and external rotation of the left hip. She does reproduce pain over the sacroiliac joint with straight leg raise. Patient has good motor strength in the left lower extremity with plantarflexion and dorsiflexion. She has point tender to palpation over the sacroiliac joint.   Imaging: No results found. No images are attached to the encounter.  Labs: Lab Results  Component Value Date   LABORGA  09/28/2016    Normal Upper Respiratory Flora No Beta Hemolytic Streptococci Isolated     Orders:  No orders of the defined types were placed in this encounter.  No orders of the defined types were placed in this encounter.    Procedures: No procedures  performed  Clinical Data: No additional findings.  ROS:  All other systems negative, except as noted in the HPI. Review of Systems  Constitutional: Negative for chills and fever.  Musculoskeletal: Positive for arthralgias and back pain.  Neurological: Negative for weakness and numbness.    Objective: Vital Signs: There were no vitals taken for this visit.  Specialty Comments:  No specialty comments available.  PMFS History: Patient Active Problem List   Diagnosis Date Noted  . Breast pain, left 04/11/2017  . Osteoporosis 05/24/2015  . Osteopenia   . Hyperlipidemia    Past Medical History:  Diagnosis Date  . Hyperlipidemia   . Osteopenia     Family History  Problem Relation Age of Onset  . Depression Mother   . Cancer Mother        breast s/p mastectomy  . Breast cancer Mother   . Alzheimer's disease Father   . Early death Maternal Grandfather 4       [place] Acc[place]    Past Surgical History:  Procedure Laterality Date  . ABDOMINAL HYSTERECTOMY     age 50, NO BSO  . BREAST BIOPSY Bilateral 1996   benign  . BREAST SURGERY     biopsy x 2, benign  . Carpel tunnel     Social History   Occupational  History  . Not on file.   Social History Main Topics  . Smoking status: Former Smoker    Types: Cigarettes    Quit date: 12/14/1997  . Smokeless tobacco: Never Used  . Alcohol use No  . Drug use: No  . Sexual activity: Not Currently

## 2017-06-22 ENCOUNTER — Encounter: Payer: Self-pay | Admitting: Family Medicine

## 2017-06-27 ENCOUNTER — Ambulatory Visit (INDEPENDENT_AMBULATORY_CARE_PROVIDER_SITE_OTHER): Payer: PPO | Admitting: *Deleted

## 2017-06-27 DIAGNOSIS — M810 Age-related osteoporosis without current pathological fracture: Secondary | ICD-10-CM

## 2017-06-27 MED ORDER — DENOSUMAB 60 MG/ML ~~LOC~~ SOLN
60.0000 mg | Freq: Once | SUBCUTANEOUS | Status: AC
  Administered 2017-06-27: 60 mg via SUBCUTANEOUS

## 2017-06-28 ENCOUNTER — Ambulatory Visit: Payer: Self-pay

## 2017-07-02 DIAGNOSIS — Z1212 Encounter for screening for malignant neoplasm of rectum: Secondary | ICD-10-CM | POA: Diagnosis not present

## 2017-07-02 DIAGNOSIS — Z1211 Encounter for screening for malignant neoplasm of colon: Secondary | ICD-10-CM | POA: Diagnosis not present

## 2017-07-04 LAB — COLOGUARD: COLOGUARD: NEGATIVE

## 2017-07-12 ENCOUNTER — Encounter: Payer: Self-pay | Admitting: *Deleted

## 2017-07-12 NOTE — Telephone Encounter (Signed)
Received Cologuard results.   Results are negative.   A negative result indicates a lower likelihood that colorectal cancer (CRC) or advanced adenoma (pre-cancer) is present. Periodic colorectal cancer screening is recommended at an interval, and with a method appropriate for the patient.  Call placed to patient and patient made aware.

## 2017-07-13 ENCOUNTER — Ambulatory Visit: Payer: PPO | Admitting: Family Medicine

## 2017-07-16 ENCOUNTER — Other Ambulatory Visit: Payer: Self-pay | Admitting: Physician Assistant

## 2017-07-16 MED FILL — SIMVASTATIN 10 MG TABLET: 10 | 90 days supply | Qty: 90 | Fill #0

## 2017-09-14 ENCOUNTER — Other Ambulatory Visit: Payer: Self-pay

## 2017-09-14 ENCOUNTER — Ambulatory Visit: Payer: PPO | Admitting: Family Medicine

## 2017-09-14 ENCOUNTER — Encounter: Payer: Self-pay | Admitting: Family Medicine

## 2017-09-14 VITALS — BP 118/62 | HR 82 | Temp 98.1°F | Resp 16 | Ht 63.0 in | Wt 156.0 lb

## 2017-09-14 DIAGNOSIS — T2220XA Burn of second degree of shoulder and upper limb, except wrist and hand, unspecified site, initial encounter: Secondary | ICD-10-CM | POA: Diagnosis not present

## 2017-09-14 DIAGNOSIS — Z91040 Latex allergy status: Secondary | ICD-10-CM | POA: Diagnosis not present

## 2017-09-14 MED ORDER — SULFAMETHOXAZOLE-TRIMETHOPRIM 800-160 MG PO TABS: 1.0000 | ORAL_TABLET | Freq: Two times a day (BID) | ORAL | 0 refills | Status: DC

## 2017-09-14 MED ORDER — SILVER SULFADIAZINE 1 % EX CREA: 1.0000 "application " | TOPICAL_CREAM | Freq: Every day | CUTANEOUS | 0 refills | Status: DC

## 2017-09-14 MED FILL — SULFAMETHOXAZOLE/TMP DS TAB: 800-160 | 7 days supply | Qty: 14 | Fill #0

## 2017-09-14 MED FILL — SSD 1% CREAM: 1 | 30 days supply | Qty: 50 | Fill #0

## 2017-09-14 NOTE — Progress Notes (Signed)
   Subjective:    Patient ID: [name], female    DOB: October 16, 1951, 66 y.o.   MRN: [id number]  Patient presents for Burn to Arm (11/16- spilled hot water to arm that she was getting out of microwave- has blistered area to L arm- has yellow drainage and pain to area)  Pt here with burn to left forearm, occurred on 11/16, she was pulling hot water out of microwave for tea and it spilled onto arm, she used cool water to rinse and then antibiotic oiuntment and bandaging She has been camping and to the beach past few weeks, but tried to keep area clean Past few days noted yellow discharge and increased pain. Was previously healing up. She also mistkingly used a latex bandage resulting in a spot of latex allergy which she has beenusing hydrocortisne on   Has cold sore- using Carmex   Review Of Systems:  GEN- denies fatigue, fever, weight loss,weakness, recent illness HEENT- denies eye drainage, change in vision, nasal discharge, CVS- denies chest pain, palpitations RESP- denies SOB, cough, wheeze ABD- denies N/V, change in stools, abd pain GU- denies dysuria, hematuria, dribbling, incontinence MSK- denies joint pain, muscle aches, injury Neuro- denies headache, dizziness, syncope, seizure activity       Objective:    BP 118/62   Pulse 82   Temp 98.1 F (36.7 C) (Oral)   Resp 16   Ht 5\' 3"  (1.6 m)   Wt 156 lb (70.8 kg)   SpO2 97%   BMI 27.63 kg/m  GEN- NAD, alert and oriented x3 Skin- Left forearm- 6x 4.5cm burn with many small blistering, yellow crusting, minimal drainage, TTP, +erythema 2 inches up, has small 2x2cm area of erythema with blistering ( Latex dermatitis)  Ext- No edema Pulse- Radial 2+        Assessment & Plan:      Problem List Items Addressed This Visit    None    Visit Diagnoses    Second degree burn of arm, initial encounter    -  Primary   Superinfection 2nd degress burn, was healing until recently. Given silvadene, bactrim, wound care  instructions given. Call in 5 days, if not improving send to wound clinic of burn    Latex allergy       cortisone BID      Note: This dictation was prepared with Dragon dictation along with smaller phrase technology. Any transcriptional errors that result from this process are unintentional.

## 2017-09-14 NOTE — Patient Instructions (Signed)
Apply silvadene daily Take antibiotics by mouth Keep covered Call Wed if not improved, Wound clinic  F/U as needed

## 2017-09-19 ENCOUNTER — Telehealth: Payer: Self-pay | Admitting: Family Medicine

## 2017-09-19 NOTE — Telephone Encounter (Signed)
-----   Message from [place], LPN sent at 07/16/7509  3:02 PM EST ----- Regarding: RE: F/U pt burn Call placed to patient.   Reports that area is healing well.  ----- Message ----- From: Alycia Rossetti, MD Sent: 09/19/2017   8:01 AM To: Eden Lathe Six, LPN Subject: FW: F/U pt burn                                Call pt see how burn is, if not improved we need to send to Wound clinic urgently  ----- Message ----- From: Alycia Rossetti, MD Sent: 09/19/2017 To: Alycia Rossetti, MD Subject: F/U pt burn

## 2017-10-22 MED FILL — SIMVASTATIN 10 MG TABLET: 10 | 90 days supply | Qty: 90 | Fill #1

## 2017-12-03 ENCOUNTER — Ambulatory Visit (INDEPENDENT_AMBULATORY_CARE_PROVIDER_SITE_OTHER): Payer: PPO

## 2017-12-03 ENCOUNTER — Ambulatory Visit: Payer: PPO | Admitting: Podiatry

## 2017-12-03 DIAGNOSIS — M2041 Other hammer toe(s) (acquired), right foot: Secondary | ICD-10-CM

## 2017-12-03 DIAGNOSIS — B351 Tinea unguium: Secondary | ICD-10-CM

## 2017-12-03 DIAGNOSIS — M7751 Other enthesopathy of right foot: Secondary | ICD-10-CM

## 2017-12-03 DIAGNOSIS — M778 Other enthesopathies, not elsewhere classified: Secondary | ICD-10-CM

## 2017-12-03 DIAGNOSIS — M779 Enthesopathy, unspecified: Secondary | ICD-10-CM

## 2017-12-03 MED ORDER — TERBINAFINE HCL 250 MG PO TABS: 250.0000 mg | ORAL_TABLET | Freq: Every day | ORAL | 0 refills | Status: DC

## 2017-12-03 MED FILL — TERBINAFINE HCL 250 MG TABS: 250 | 90 days supply | Qty: 90 | Fill #0

## 2017-12-03 NOTE — Progress Notes (Signed)
   HPI: 67 year old female presents the office today for follow-up treatment and evaluation regarding forefoot reconstructive surgery which was performed on 11/09/2016.  Patient underwent hammertoe correction as well as first MTPJ arthrodesis of the right foot.  Patient states that her the toes on her right foot are constantly painful and seem to be getting worse.  She is somewhat disappointed with the outcome of the surgery due to the failure to alleviate any pain.  She also suffers from osteoporosis and a fracture on her pubic bone which appears to be chronic in nature apparently. The patient also presents with a new complaint regarding a thick painful right great toenail with possible fungus.  Patient recently had a hepatic function panel performed which was within normal limits.  She presents today for further treatment and evaluation  Past Medical History:  Diagnosis Date  . Hyperlipidemia   . Osteopenia      Physical Exam: General: The patient is alert and oriented x3 in no acute distress.  Dermatology: Skin is warm, dry and supple bilateral lower extremities. Negative for open lesions or macerations.  Hyperkeratotic discoloration noted to the right great toenail plate.  There is some pain on palpation also noted to the nail plate.  Vascular: Palpable pedal pulses bilaterally. No edema or erythema noted. Capillary refill within normal limits.  Neurological: Epicritic and protective threshold grossly intact bilaterally.   Musculoskeletal Exam: Range of motion within normal limits to all pedal and ankle joints bilateral. Muscle strength 5/5 in all groups bilateral.  Pain on palpation noted to the metatarsophalangeal joints of the right forefoot.  Radiographic Exam:  Orthopedic hardware appears to be intact.  Routine healing noted.  There is some lateral deviation of the distal aspect of digits 2 and 3 of the right foot likely due to an elongated toe and rubbing of shoe  gear.  Assessment: -Chronic pain right forefoot -Capsulitis right forefoot -Onychomycosis right great toe   Plan of Care:  -Patient was evaluated today.  X-rays reviewed today. -Today I am going to set up the patient with an appointment with [facility] for custom molded insoles to help alleviate pressure from the forefoot. -Prescription for terbinafine 250 mg #90 to address the onychomycosis of the right great toenail -Return to clinic as needed   Edrick Kins, DPM [facility]  Dr. Edrick Kins, DPM    [address], [place] [address]                Office [phone]  Fax [phone]

## 2017-12-18 ENCOUNTER — Other Ambulatory Visit (INDEPENDENT_AMBULATORY_CARE_PROVIDER_SITE_OTHER): Payer: PPO

## 2017-12-18 DIAGNOSIS — M2041 Other hammer toe(s) (acquired), right foot: Secondary | ICD-10-CM

## 2017-12-18 DIAGNOSIS — M7751 Other enthesopathy of right foot: Secondary | ICD-10-CM

## 2018-01-01 ENCOUNTER — Ambulatory Visit (INDEPENDENT_AMBULATORY_CARE_PROVIDER_SITE_OTHER): Payer: PPO

## 2018-01-01 DIAGNOSIS — M810 Age-related osteoporosis without current pathological fracture: Secondary | ICD-10-CM | POA: Diagnosis not present

## 2018-01-01 MED ORDER — DENOSUMAB 60 MG/ML ~~LOC~~ SOLN
60.0000 mg | Freq: Once | SUBCUTANEOUS | Status: AC
  Administered 2018-01-01: 60 mg via SUBCUTANEOUS

## 2018-01-01 NOTE — Progress Notes (Signed)
Patient was in office for prolia injection.Patient received the injection in her right arm subcutaneous.Patient tolerated well  

## 2018-01-07 ENCOUNTER — Other Ambulatory Visit: Payer: Self-pay

## 2018-01-07 ENCOUNTER — Encounter: Payer: Self-pay | Admitting: Physician Assistant

## 2018-01-07 ENCOUNTER — Ambulatory Visit (INDEPENDENT_AMBULATORY_CARE_PROVIDER_SITE_OTHER): Payer: PPO | Admitting: Physician Assistant

## 2018-01-07 VITALS — BP 138/84 | HR 93 | Temp 98.6°F | Resp 14 | Wt 149.2 lb

## 2018-01-07 DIAGNOSIS — J988 Other specified respiratory disorders: Secondary | ICD-10-CM

## 2018-01-07 DIAGNOSIS — B9689 Other specified bacterial agents as the cause of diseases classified elsewhere: Secondary | ICD-10-CM

## 2018-01-07 DIAGNOSIS — R52 Pain, unspecified: Secondary | ICD-10-CM | POA: Diagnosis not present

## 2018-01-07 LAB — INFLUENZA A AND B AG, IMMUNOASSAY
INFLUENZA A ANTIGEN: NOT DETECTED
INFLUENZA B ANTIGEN: NOT DETECTED

## 2018-01-07 MED ORDER — AZITHROMYCIN 250 MG PO TABS: ORAL_TABLET | ORAL | 0 refills | Status: DC

## 2018-01-07 MED ORDER — HYDROCODONE-HOMATROPINE 5-1.5 MG/5ML PO SYRP: 5.0000 mL | ORAL_SOLUTION | Freq: Four times a day (QID) | ORAL | 0 refills | Status: DC | PRN

## 2018-01-07 MED FILL — HYDROCODONE-HOMATROPINE SYR: 5-1.5 | 6 days supply | Qty: 120 | Fill #0

## 2018-01-07 MED FILL — AZITHROMYCIN 250 MG TABLET: 250 | 5 days supply | Qty: 6 | Fill #0

## 2018-01-07 NOTE — Progress Notes (Signed)
Patient ID: TAL NEER MRN: [id number], DOB: Oct 14, 1951, 67 y.o. Date of Encounter: 01/07/2018, 3:02 PM    Chief Complaint:  Chief Complaint  Patient presents with  . low grade fever    started last thursday   . Cough  . Headache    when coughing  . Generalized Body Aches     HPI: 67 y.o. year old female presents with above.    Says that symptoms started last Thursday.  At that time she had some low-grade fever and also felt chills and had sneezing and cough. Friday she thought she was feeling a little better. However on Saturday "felt like she had been hit by a truck ".  Hurt all over.  Had fever of 100 degrees.  Felt chills.  Stayed on the couch. Sunday she stayed home from church.  Continued with some sneezing cough headache. Continues with congestion in her head and nose and also with chest congestion and cough.    Home Meds:   Outpatient Medications Prior to Visit  Medication Sig Dispense Refill  . acetaminophen (TYLENOL) 325 MG tablet Take 2 tablets (650 mg total) by mouth every 6 (six) hours as needed for mild pain, moderate pain or fever. 60 tablet 0  . Calcium Carbonate-Vit D-Min (CALCIUM 1200 PO) Take 1,200 mg by mouth.    . cholecalciferol (VITAMIN D) 1000 UNITS tablet Take 1,000 Units by mouth daily.    Marland Kitchen denosumab (PROLIA) 60 MG/ML SOLN injection Inject 60 mg into the skin every 6 (six) months. Administer in upper arm, thigh, or abdomen    . diclofenac sodium (VOLTAREN) 1 % GEL Apply 2 g topically 4 (four) times daily. 100 g 1  . NONFORMULARY OR COMPOUNDED ITEM [facility]:  Antiinflammatory Cream - Diclofenac 3%, Baclofen 2%, Lidocaine 2%, apply 1-2 grams to affected area 3-4 times daily. 480 each 11  . Omega-3 Fatty Acids (FISH OIL) 1200 MG CAPS Take 1 capsule by mouth daily.    . silver sulfADIAZINE (SILVADENE) 1 % cream Apply 1 application topically daily. 50 g 0  . simvastatin (ZOCOR) 10 MG tablet TAKE 1 TABLET BY MOUTH DAILY. 90 tablet 3  .  terbinafine (LAMISIL) 250 MG tablet Take 1 tablet (250 mg total) by mouth daily. 90 tablet 0  . sulfamethoxazole-trimethoprim (BACTRIM DS,SEPTRA DS) 800-160 MG tablet Take 1 tablet by mouth 2 (two) times daily. 14 tablet 0   No facility-administered medications prior to visit.     Allergies:  Allergies  Allergen Reactions  . Asa [Aspirin] Other (See Comments)    Stomach irritation  . Ibuprofen Other (See Comments)    Stomach irritation  . Latex       Review of Systems: See HPI for pertinent ROS. All other ROS negative.    Physical Exam: Blood pressure 138/84, pulse 93, temperature 98.6 F (37 C), temperature source Oral, resp. rate 14, weight 67.7 kg (149 lb 3.2 oz), SpO2 96 %., Body mass index is 26.43 kg/m. General:  WNWD WF. Appears in no acute distress. HEENT: Normocephalic, atraumatic, eyes without discharge, sclera non-icteric, nares are without discharge. Bilateral auditory canals clear, TM's are without perforation, pearly grey and translucent with reflective cone of light bilaterally. Oral cavity moist, posterior pharynx without exudate, erythema, peritonsillar abscess.  Neck: Supple. No thyromegaly. No lymphadenopathy. Lungs: Clear bilaterally to auscultation without wheezes, rales, or rhonchi. Breathing is unlabored. Heart: Regular rhythm. No murmurs, rubs, or gallops. Extremities/Skin: Warm and dry.  Neuro: Alert and oriented X 3.  Moves all extremities spontaneously. Gait is normal. CNII-XII grossly in tact. Psych:  Responds to questions appropriately with a normal affect.     ASSESSMENT AND PLAN:  67 y.o. year old female with  1. Bacterial respiratory infection She is to take the antibiotic as directed.  Can use the Hycodan as cough suppressant in the interim.  Follow-up if symptoms do not resolve within 1 week after completion of antibiotic. - azithromycin (ZITHROMAX) 250 MG tablet; Day 1: Take 2 daily.  Days 2-5: Take 1 daily.  Dispense: 6 tablet; Refill: 0 -  HYDROcodone-homatropine (HYCODAN) 5-1.5 MG/5ML syrup; Take 5 mLs by mouth every 6 (six) hours as needed.  Dispense: 120 mL; Refill: 0  2. Generalized body aches Flu test is negative. - Influenza A and B Ag, Immunoassay   Signed, [address] [place], [place], [facility] 01/07/2018 3:02 PM

## 2018-01-18 MED FILL — SIMVASTATIN 10 MG TABLET: 10 | 90 days supply | Qty: 90 | Fill #2

## 2018-01-21 ENCOUNTER — Ambulatory Visit (INDEPENDENT_AMBULATORY_CARE_PROVIDER_SITE_OTHER): Payer: PPO | Admitting: Orthotics

## 2018-01-21 DIAGNOSIS — M2041 Other hammer toe(s) (acquired), right foot: Secondary | ICD-10-CM

## 2018-01-21 NOTE — Progress Notes (Signed)
Patient presents today for evaluation/casting for AFO brace (L).   Patient has hx of the following conditions: Gait instability,  Ankle instabilty,  Gait analysis done and patient displays abnormality of gait in both sagittial and frontal planes, and could benefit in aggressive ankle support.  Patient chose [place] brace w/ lace/speed laces. Patient came in today to pick up custom made foot orthotics.  The goals were accomplished and the patient reported no dissatisfaction with said orthotics.  Patient was advised of breakin period and how to report any issues.

## 2018-03-01 DIAGNOSIS — Z1231 Encounter for screening mammogram for malignant neoplasm of breast: Secondary | ICD-10-CM | POA: Diagnosis not present

## 2018-03-01 DIAGNOSIS — Z803 Family history of malignant neoplasm of breast: Secondary | ICD-10-CM | POA: Diagnosis not present

## 2018-03-01 LAB — HM MAMMOGRAPHY

## 2018-03-14 ENCOUNTER — Encounter: Payer: Self-pay | Admitting: *Deleted

## 2018-04-19 MED FILL — SIMVASTATIN 10 MG TABLET: 10 | 90 days supply | Qty: 90 | Fill #3

## 2018-05-13 ENCOUNTER — Ambulatory Visit: Payer: PPO | Admitting: Podiatry

## 2018-05-13 ENCOUNTER — Encounter: Payer: Self-pay | Admitting: Podiatry

## 2018-05-13 ENCOUNTER — Ambulatory Visit (INDEPENDENT_AMBULATORY_CARE_PROVIDER_SITE_OTHER): Payer: PPO

## 2018-05-13 DIAGNOSIS — M2041 Other hammer toe(s) (acquired), right foot: Secondary | ICD-10-CM

## 2018-05-13 DIAGNOSIS — G5791 Unspecified mononeuropathy of right lower limb: Secondary | ICD-10-CM | POA: Diagnosis not present

## 2018-05-13 DIAGNOSIS — Z9889 Other specified postprocedural states: Secondary | ICD-10-CM

## 2018-05-13 MED ORDER — GABAPENTIN 100 MG PO CAPS: 100.0000 mg | ORAL_CAPSULE | Freq: Every day | ORAL | 1 refills | Status: DC

## 2018-05-13 MED FILL — GABAPENTIN 100 MG CAP: 100 | 60 days supply | Qty: 60 | Fill #0

## 2018-05-14 NOTE — Progress Notes (Signed)
   HPI: 67 year old female presents the office today for follow-up treatment and evaluation regarding forefoot reconstructive surgery which was performed on 11/09/2016.  Patient underwent hammertoe correction as well as first MTPJ arthrodesis of the right foot.  Patient states that she has had chronic pain and tenderness associated with the surgery ever since.  She expenses pins-and-needles with numb sensation overlying the first MTPJ of the surgical foot.  She is been soaking it and also taking oral diclofenac.  Patient presents today for further treatment evaluation would like to possibly talk about revisional surgery.  Past Medical History:  Diagnosis Date  . Hyperlipidemia   . Osteopenia      Physical Exam: General: The patient is alert and oriented x3 in no acute distress.  Dermatology: Skin is warm, dry and supple bilateral lower extremities. Negative for open lesions or macerations.  Hyperkeratotic discoloration noted to the right great toenail plate.  There is some pain on palpation also noted to the nail plate.  Vascular: Palpable pedal pulses bilaterally. No edema or erythema noted. Capillary refill within normal limits.  Neurological: Epicritic and protective threshold grossly intact bilaterally.  Positive Tinel sign noted with percussion overlying the first MTPJ likely consistent with a neuritis of the dorsal medial cutaneous nerve.  Paresthesia also noted.  Musculoskeletal Exam: Pain on palpation noted to the PIPJ of the fourth digit right foot consistent with a toe capsulitis.  Patient has had recent PIPJ arthroplasty performed at the site.  Elongated toes noted digits 2, 3 of the right foot as well with lateral deviation at the  arthroplasty sites  As evident on radiographic exam  Radiographic Exam:  Orthopedic hardware appears to be intact.  Routine healing noted.  There is some lateral deviation of the distal aspect of digits 2 and 3 of the right foot likely due to an elongated  toe and rubbing of shoe gear.  Assessment: -Neuritis dorsal medial cutaneous nerve right foot -Elongated digits 2, 3 right foot with contracture -Toe capsulitis fourth digit right foot   Plan of Care:  -Patient was evaluated today.  X-rays reviewed today. -Prescription for gabapentin 100 mg nightly to help alleviate neuritis symptoms especially at nighttime when patient tries to go to sleep -Today we discussed surgical versus conservative management.  Patient also has received a pair of custom molded orthotics which are not alleviating symptoms.  Patient would likely benefit from surgical intervention. -Surgery would consist of neural lysis dorsal medial cutaneous nerve right foot.  Removal of hardware first MTPJ right foot.  Arthroplasty digits 2, 3, 4 right foot. -The patient's spouse is not present today.  She would like to have the spells come back and we can all discuss further treatment and discussed the surgery in detail and the postoperative recovery time.. -Return to clinic at the patient's convenience when the spouse is present   Edrick Kins, DPM [facility]  Dr. Edrick Kins, DPM    [address], [place] [address]                Office [phone]  Fax [phone]

## 2018-06-04 ENCOUNTER — Encounter: Payer: Self-pay | Admitting: Family Medicine

## 2018-06-04 DIAGNOSIS — D485 Neoplasm of uncertain behavior of skin: Secondary | ICD-10-CM | POA: Diagnosis not present

## 2018-06-04 DIAGNOSIS — L820 Inflamed seborrheic keratosis: Secondary | ICD-10-CM | POA: Diagnosis not present

## 2018-06-04 DIAGNOSIS — D2372 Other benign neoplasm of skin of left lower limb, including hip: Secondary | ICD-10-CM | POA: Diagnosis not present

## 2018-06-04 DIAGNOSIS — L570 Actinic keratosis: Secondary | ICD-10-CM | POA: Diagnosis not present

## 2018-06-10 ENCOUNTER — Ambulatory Visit: Payer: PPO | Admitting: Podiatry

## 2018-06-10 DIAGNOSIS — M2041 Other hammer toe(s) (acquired), right foot: Secondary | ICD-10-CM

## 2018-06-10 DIAGNOSIS — G5791 Unspecified mononeuropathy of right lower limb: Secondary | ICD-10-CM

## 2018-06-10 NOTE — Patient Instructions (Signed)
Pre-Operative Instructions  Congratulations, you have decided to take an important step towards improving your quality of life.  You can be assured that the doctors and staff at [facility] will be with you every step of the way.  Here are some important things you should know:  1. Plan to be at the surgery center/hospital at least 1 (one) hour prior to your scheduled time, unless otherwise directed by the surgical center/hospital staff.  You must have a responsible adult accompany you, remain during the surgery and drive you home.  Make sure you have directions to the surgical center/hospital to ensure you arrive on time. 2. If you are having surgery at [facility] or [facility], you will need a copy of your medical history and physical form from your family physician within one month prior to the date of surgery. We will give you a form for your primary physician to complete.  3. We make every effort to accommodate the date you request for surgery.  However, there are times where surgery dates or times have to be moved.  We will contact you as soon as possible if a change in schedule is required.   4. No aspirin/ibuprofen for one week before surgery.  If you are on aspirin, any non-steroidal anti-inflammatory medications (Mobic, Aleve, Ibuprofen) should not be taken seven (7) days prior to your surgery.  You make take Tylenol for pain prior to surgery.  5. Medications - If you are taking daily heart and blood pressure medications, seizure, reflux, allergy, asthma, anxiety, pain or diabetes medications, make sure you notify the surgery center/hospital before the day of surgery so they can tell you which medications you should take or avoid the day of surgery. 6. No food or drink after midnight the night before surgery unless directed otherwise by surgical center/hospital staff. 7. No alcoholic beverages 24-hours prior to surgery.  No smoking 24-hours prior or 24-hours after  surgery. 8. Wear loose pants or shorts. They should be loose enough to fit over bandages, boots, and casts. 9. Don't wear slip-on shoes. Sneakers are preferred. 10. Bring your boot with you to the surgery center/hospital.  Also bring crutches or a walker if your physician has prescribed it for you.  If you do not have this equipment, it will be provided for you after surgery. 11. If you have not been contacted by the surgery center/hospital by the day before your surgery, call to confirm the date and time of your surgery. 12. Leave-time from work may vary depending on the type of surgery you have.  Appropriate arrangements should be made prior to surgery with your employer. 13. Prescriptions will be provided immediately following surgery by your doctor.  Fill these as soon as possible after surgery and take the medication as directed. Pain medications will not be refilled on weekends and must be approved by the doctor. 14. Remove nail polish on the operative foot and avoid getting pedicures prior to surgery. 15. Wash the night before surgery.  The night before surgery wash the foot and leg well with water and the antibacterial soap provided. Be sure to pay special attention to beneath the toenails and in between the toes.  Wash for at least three (3) minutes. Rinse thoroughly with water and dry well with a towel.  Perform this wash unless told not to do so by your physician.  Enclosed: 1 Ice pack (please put in freezer the night before surgery)   1 Hibiclens skin cleaner     Pre-op instructions  If you have any questions regarding the instructions, please do not hesitate to call our office.  [place]: [address], [place], [place] [address] -- [phone]  [place]: [address] [place], [place] [address] -- [phone]  [place]: [address]  [place], [place] [address] -- [phone]  [place]: [address], [address], [place], [place] [address] -- [phone]  Website:  [email or web address] 

## 2018-06-12 NOTE — Progress Notes (Signed)
   HPI: 67 year old female presents the office today for follow-up evaluation of right foot pain. She is here to discuss surgical treatment of her foot. There are no modifying factors noted. She has not done anything at home for treatment. Patient is here for further evaluation and treatment.   Past Medical History:  Diagnosis Date  . Hyperlipidemia   . Osteopenia      Physical Exam: General: The patient is alert and oriented x3 in no acute distress.  Dermatology: Skin is warm, dry and supple bilateral lower extremities. Negative for open lesions or macerations.  Hyperkeratotic discoloration noted to the right great toenail plate.  There is some pain on palpation also noted to the nail plate.  Vascular: Palpable pedal pulses bilaterally. No edema or erythema noted. Capillary refill within normal limits.  Neurological: Epicritic and protective threshold grossly intact bilaterally.  Positive Tinel sign noted with percussion overlying the first MTPJ likely consistent with a neuritis of the dorsal medial cutaneous nerve.  Paresthesia also noted.  Musculoskeletal Exam: Pain on palpation noted to the PIPJ of the fourth digit right foot consistent with a toe capsulitis.  Patient has had recent PIPJ arthroplasty performed at the site.  Elongated toes noted digits 2, 3 of the right foot as well with lateral deviation at the  arthroplasty sites  As evident on radiographic exam   Assessment: 1. Neuritis dorsal medial cutaneous nerve right foot 2. Elongated digits 2, 3 right foot with contracture 3. Toe capsulitis fourth digit right foot   Plan of Care:  1. Patient was evaluated today.   2. Today we discussed the conservative versus surgical management of the presenting pathology. The patient opts for surgical management. All possible complications and details of the procedure were explained. All patient questions were answered. No guarantees were expressed or implied. 3. Authorization for  surgery was initiated today. Surgery will consist of neural lysis dorsal medial cutaneous nerve right foot.  Removal of hardware first MTPJ right foot.  Arthroplasty digits 2, 3, 4 right foot. 4. Return to clinic one week post op.   Edrick Kins, DPM [facility]  Dr. Edrick Kins, DPM    [address], [place] [address]                Office [phone]  Fax [phone]

## 2018-06-20 DIAGNOSIS — L570 Actinic keratosis: Secondary | ICD-10-CM | POA: Diagnosis not present

## 2018-07-04 ENCOUNTER — Encounter: Payer: Self-pay | Admitting: Family Medicine

## 2018-07-04 ENCOUNTER — Ambulatory Visit (INDEPENDENT_AMBULATORY_CARE_PROVIDER_SITE_OTHER): Payer: PPO | Admitting: Family Medicine

## 2018-07-04 ENCOUNTER — Ambulatory Visit: Payer: PPO

## 2018-07-04 VITALS — BP 124/74 | HR 60 | Temp 98.3°F | Resp 12 | Ht 63.0 in | Wt 148.0 lb

## 2018-07-04 DIAGNOSIS — E7800 Pure hypercholesterolemia, unspecified: Secondary | ICD-10-CM | POA: Diagnosis not present

## 2018-07-04 DIAGNOSIS — M810 Age-related osteoporosis without current pathological fracture: Secondary | ICD-10-CM

## 2018-07-04 DIAGNOSIS — Z23 Encounter for immunization: Secondary | ICD-10-CM

## 2018-07-04 DIAGNOSIS — G609 Hereditary and idiopathic neuropathy, unspecified: Secondary | ICD-10-CM | POA: Diagnosis not present

## 2018-07-04 DIAGNOSIS — Z0000 Encounter for general adult medical examination without abnormal findings: Secondary | ICD-10-CM | POA: Diagnosis not present

## 2018-07-04 DIAGNOSIS — E559 Vitamin D deficiency, unspecified: Secondary | ICD-10-CM

## 2018-07-04 LAB — COMPLETE METABOLIC PANEL WITH GFR
AG Ratio: 1.7 (calc) (ref 1.0–2.5)
ALBUMIN MSPROF: 4.7 g/dL (ref 3.6–5.1)
ALT: 25 U/L (ref 6–29)
AST: 28 U/L (ref 10–35)
Alkaline phosphatase (APISO): 45 U/L (ref 33–130)
BUN: 9 mg/dL (ref 7–25)
CALCIUM: 9.7 mg/dL (ref 8.6–10.4)
CHLORIDE: 104 mmol/L (ref 98–110)
CO2: 27 mmol/L (ref 20–32)
CREATININE: 0.62 mg/dL (ref 0.50–0.99)
GFR, EST AFRICAN AMERICAN: 107 mL/min/{1.73_m2} (ref >=60)
GFR, Est Non African American: 93 mL/min/{1.73_m2} (ref >=60)
Globulin: 2.8 g/dL (calc) (ref 1.9–3.7)
Glucose, Bld: 97 mg/dL (ref 65–99)
Potassium: 4.6 mmol/L (ref 3.5–5.3)
Sodium: 140 mmol/L (ref 135–146)
TOTAL PROTEIN: 7.5 g/dL (ref 6.1–8.1)
Total Bilirubin: 0.7 mg/dL (ref 0.2–1.2)

## 2018-07-04 LAB — CBC WITH DIFFERENTIAL/PLATELET
BASOS PCT: 1.1 %
Basophils Absolute: 58 cells/uL (ref 0–200)
Eosinophils Absolute: 249 cells/uL (ref 15–500)
Eosinophils Relative: 4.7 %
HCT: 39 % (ref 35.0–45.0)
HEMOGLOBIN: 13.3 g/dL (ref 11.7–15.5)
LYMPHS ABS: 1712 {cells}/uL (ref 850–3900)
MCH: 31.1 pg (ref 27.0–33.0)
MCHC: 34.1 g/dL (ref 32.0–36.0)
MCV: 91.1 fL (ref 80.0–100.0)
MPV: 11.5 fL (ref 7.5–12.5)
Monocytes Relative: 9.2 %
NEUTROS ABS: 2793 {cells}/uL (ref 1500–7800)
Neutrophils Relative %: 52.7 %
PLATELETS: 318 10*3/uL (ref 140–400)
RBC: 4.28 10*6/uL (ref 3.80–5.10)
RDW: 11.7 % (ref 11.0–15.0)
TOTAL LYMPHOCYTE: 32.3 %
WBC mixed population: 488 cells/uL (ref 200–950)
WBC: 5.3 10*3/uL (ref 3.8–10.8)

## 2018-07-04 LAB — LIPID PANEL
CHOL/HDL RATIO: 3 (calc) (ref <5.0)
Cholesterol: 187 mg/dL (ref <200)
HDL: 62 mg/dL (ref >50)
LDL CHOLESTEROL (CALC): 102 mg/dL — AB
Non-HDL Cholesterol (Calc): 125 mg/dL (calc) (ref <130)
Triglycerides: 134 mg/dL (ref <150)

## 2018-07-04 MED ORDER — AMITRIPTYLINE HCL 50 MG PO TABS: 50.0000 mg | ORAL_TABLET | Freq: Every evening | ORAL | 2 refills | Status: DC | PRN

## 2018-07-04 MED ORDER — DENOSUMAB 60 MG/ML ~~LOC~~ SOSY
60.0000 mg | PREFILLED_SYRINGE | Freq: Once | SUBCUTANEOUS | Status: AC
  Administered 2018-07-04: 60 mg via SUBCUTANEOUS

## 2018-07-04 NOTE — Progress Notes (Signed)
Subjective:    Patient ID: [name], female    DOB: 06/27/1951, 67 y.o.   MRN: [id number]  HPI Patient is a very pleasant 67 year old white female who is here today for complete physical exam.  Patient is currently on Prolia for osteoporosis.  Her bone density is up-to-date.  Her mammogram was done in May and is normal.  She had Cologuard performed last year for colon cancer screening that was normal.  This is good for an additional 2 years.  Past medical history significant for hysterectomy and therefore she does not require Pap smear.  Her immunizations are up-to-date except for her flu shot which she would like to receive today.  We did discuss the shingles vaccine. Immunization History  Administered Date(s) Administered  . Influenza, High Dose Seasonal PF 06/14/2016  . Influenza,inj,Quad PF,6+ Mos 07/15/2015, 06/19/2017  . Influenza-Unspecified 07/16/2014  . Pneumococcal Conjugate-13 05/29/2016  . Pneumococcal Polysaccharide-23 12/15/2014  . Tdap 03/16/2014    Past Medical History:  Diagnosis Date  . Hyperlipidemia   . Osteopenia    Past Surgical History:  Procedure Laterality Date  . ABDOMINAL HYSTERECTOMY     age 67, NO BSO  . BREAST BIOPSY Bilateral 1996   benign  . BREAST SURGERY     biopsy x 2, benign  . Carpel tunnel     Current Outpatient Medications on File Prior to Visit  Medication Sig Dispense Refill  . acetaminophen (TYLENOL) 325 MG tablet Take 2 tablets (650 mg total) by mouth every 6 (six) hours as needed for mild pain, moderate pain or fever. 60 tablet 0  . Calcium Carbonate-Vit D-Min (CALCIUM 1200 PO) Take 1,200 mg by mouth.    . cholecalciferol (VITAMIN D) 1000 UNITS tablet Take 1,000 Units by mouth daily.    Marland Kitchen denosumab (PROLIA) 60 MG/ML SOLN injection Inject 60 mg into the skin every 6 (six) months. Administer in upper arm, thigh, or abdomen    . diclofenac sodium (VOLTAREN) 1 % GEL Apply 2 g topically 4 (four) times daily. 100 g 1  .  NONFORMULARY OR COMPOUNDED ITEM [facility]:  Antiinflammatory Cream - Diclofenac 3%, Baclofen 2%, Lidocaine 2%, apply 1-2 grams to affected area 3-4 times daily. 480 each 11  . Omega-3 Fatty Acids (FISH OIL) 1200 MG CAPS Take 1 capsule by mouth daily.    . silver sulfADIAZINE (SILVADENE) 1 % cream Apply 1 application topically daily. 50 g 0  . simvastatin (ZOCOR) 10 MG tablet TAKE 1 TABLET BY MOUTH DAILY. 90 tablet 3   No current facility-administered medications on file prior to visit.    Allergies  Allergen Reactions  . Asa [Aspirin] Other (See Comments)    Stomach irritation  . Ibuprofen Other (See Comments)    Stomach irritation  . Latex    Social History   Socioeconomic History  . Marital status: Married    Spouse name: Not on file  . Number of children: Not on file  . Years of education: Not on file  . Highest education level: Not on file  Occupational History  . Not on file  Social Needs  . Financial resource strain: Not on file  . Food insecurity:    Worry: Not on file    Inability: Not on file  . Transportation needs:    Medical: Not on file    Non-medical: Not on file  Tobacco Use  . Smoking status: Former Smoker    Types: Cigarettes    Last attempt to quit:  12/14/1997    Years since quitting: 20.5  . Smokeless tobacco: Never Used  Substance and Sexual Activity  . Alcohol use: No  . Drug use: No  . Sexual activity: Not Currently  Lifestyle  . Physical activity:    Days per week: Not on file    Minutes per session: Not on file  . Stress: Not on file  Relationships  . Social connections:    Talks on phone: Not on file    Gets together: Not on file    Attends religious service: Not on file    Active member of club or organization: Not on file    Attends meetings of clubs or organizations: Not on file    Relationship status: Not on file  . Intimate partner violence:    Fear of current or ex partner: Not on file    Emotionally abused: Not on file     Physically abused: Not on file    Forced sexual activity: Not on file  Other Topics Concern  . Not on file  Social History Narrative  . Not on file   Family History  Problem Relation Age of Onset  . Depression Mother   . Cancer Mother        breast s/p mastectomy  . Breast cancer Mother   . Alzheimer's disease Father   . Early death Maternal Grandfather 53       [place] Acc[place]     Review of Systems  All other systems reviewed and are negative.      Objective:   Physical Exam  Constitutional: She is oriented to person, place, and time. She appears well-developed and well-nourished. No distress.  HENT:  Head: Normocephalic and atraumatic.  Right Ear: External ear normal.  Left Ear: External ear normal.  Nose: Nose normal.  Mouth/Throat: Oropharynx is clear and moist. No oropharyngeal exudate.  Eyes: Pupils are equal, round, and reactive to light. Conjunctivae and EOM are normal. Right eye exhibits no discharge. Left eye exhibits no discharge. No scleral icterus.  Neck: Normal range of motion. Neck supple. No JVD present. No tracheal deviation present. No thyromegaly present.  Cardiovascular: Normal rate, regular rhythm, normal heart sounds and intact distal pulses. Exam reveals no gallop and no friction rub.  No murmur heard. Pulmonary/Chest: Effort normal and breath sounds normal. No stridor. No respiratory distress. She has no wheezes. She has no rales. She exhibits no tenderness.  Abdominal: Soft. Bowel sounds are normal. She exhibits no distension and no mass. There is no tenderness. There is no rebound and no guarding.  Musculoskeletal: Normal range of motion. She exhibits no edema or tenderness.  Lymphadenopathy:    She has no cervical adenopathy.  Neurological: She is alert and oriented to person, place, and time. She has normal reflexes. No cranial nerve deficit. She exhibits normal muscle tone. Coordination normal.  Skin: Skin is warm. No rash noted. She is not  diaphoretic. No erythema. No pallor.  Psychiatric: She has a normal mood and affect. Her behavior is normal. Judgment and thought content normal.  Vitals reviewed.         Assessment & Plan:  Routine general medical examination at a health care facility  Osteoporosis, unspecified osteoporosis type, unspecified pathological fracture presence - Plan: VITAMIN D 25 Hydroxy (Vit-D Deficiency, Fractures)  Pure hypercholesterolemia - Plan: CBC with Differential/Platelet, COMPLETE METABOLIC PANEL WITH GFR, Lipid panel  Physical exam is completely normal.  Patient will continue Prolia, vitamin D, and calcium for osteoporosis.  I would recheck a bone density test next year after 2 years of therapy.  I will also check a vitamin D.  Check a CBC, CMP and a fasting lipid panel to monitor her cholesterol.  She received her flu shot today.  I recommended the shingles vaccine.  Colon cancer screening is up-to-date with a Cologuard.  Mammogram has been performed and is up-to-date.  She continues to complain of severe neuropathic pain in both feet right greater than left.  This persists despite trying gabapentin under the care of a podiatrist.  The pain is worsening and sounds neuropathic in nature.  I will try the patient on amitriptyline 50 mg p.o. nightly and consult neurology for possible nerve conduction studies.

## 2018-07-04 NOTE — Addendum Note (Signed)
Addended by: Shary Decamp B on: 07/04/2018 10:03 AM   Modules accepted: Orders

## 2018-07-05 LAB — VITAMIN D 25 HYDROXY (VIT D DEFICIENCY, FRACTURES): VIT D 25 HYDROXY: 61 ng/mL (ref 30–100)

## 2018-07-05 MED FILL — AMITRIPTYLINE HCL 50 MG TAB: 50 | 30 days supply | Qty: 30 | Fill #0

## 2018-07-17 MED FILL — SHINGRIX 50 MCG SUS: 50 | 1 days supply | Qty: 1 | Fill #0

## 2018-07-22 ENCOUNTER — Other Ambulatory Visit: Payer: Self-pay | Admitting: Family Medicine

## 2018-07-22 MED FILL — SIMVASTATIN 10 MG TABLET: 10 | 90 days supply | Qty: 90 | Fill #0

## 2018-08-26 ENCOUNTER — Telehealth: Payer: Self-pay | Admitting: Neurology

## 2018-08-26 ENCOUNTER — Ambulatory Visit (INDEPENDENT_AMBULATORY_CARE_PROVIDER_SITE_OTHER): Payer: PPO | Admitting: Neurology

## 2018-08-26 ENCOUNTER — Encounter: Payer: Self-pay | Admitting: Neurology

## 2018-08-26 VITALS — BP 131/78 | HR 70 | Ht 63.0 in | Wt 149.8 lb

## 2018-08-26 DIAGNOSIS — R202 Paresthesia of skin: Secondary | ICD-10-CM

## 2018-08-26 NOTE — Telephone Encounter (Signed)
Health team order sent to GI. No auth they will reach out to the pt to schedule.  °

## 2018-08-26 NOTE — Telephone Encounter (Signed)
lvm for pt to be aware . I left GI phone number of [phone] and if she has not heard from them in the next 2-3 business days to give them a call.

## 2018-08-26 NOTE — Progress Notes (Signed)
PATIENT: [name] DOB: May 03, 1951  Chief Complaint  Patient presents with  . New Patient (Initial Visit)    EMG room 4, alone.  Referral from Juluis Pitch for peripheral neuropathy. Right foot very painful. Cannot put covers over her foot, cannot wear socks. Had surgery last January 2019 and this is when sx worsened. Left foot painful also but not as severe as right foot.   Marland Kitchen PCP    Jenna Luo, MD  . Peripheral Neuropathy    Has tried amitriptyline in the past but had SE-very tired, faitgued, "felt like a zombie", Also tried gabapentin, had SE- dizzy, sleepy. Takes turmeric . Has seen [facility] Ha[facility] at [facility]. Tried foot orthotics bilaterally but only relieves sx for about 20 minutes.       HISTORICAL  [name] is a 67 year old female, seen in request by her primary care doctor Jenna Luo for evaluation of peripheral neuropathy, initial evaluation was on August 26, 2018.  I have reviewed and summarized the referring note from the referring physician.  She had past medical history of hyperlipidemia, osteoporosis.  She had multiple foot surgery,  First surgery was on the left foot in late 1980s, she had left bunionectomy, but has trouble with the pin, has to has multiple recurrent surgery, then she began to have persistent left total pain, bottom of the feet numbness, she has to walk awkward,  She had a right bunionectomy in 2015, this did not solve her right foot pain, then she had multiple right foot hammertoe correction surgery, Baker's cyst in January 2018, this did not solve her problem either.  She continued to complains of bilateral plantar surface burning pain, multiple toe joint pain, difficulty walking,  Because of multiple surgery in 2018, she was not able to bear weight for 6 months, had acute worsening of osteoporosis, suffered multiple compression fracture in June 2018, now taking Prolia, which has helped.  She had a history of chronic neck  pain, reported abnormal MRI cervical in the past, also had bilateral carpal tunnel release surgery, had bilateral finger Raynolds phenomenon in cold weather, she has chronic low back pain, but denies radiating pain, she denies bowel and bladder incontinence.  Over the years, she has tried gabapentin, amitriptyline, did not help her symptoms, she cannot tolerate the side effect of drowsiness, dizziness, she is now using compounding cream and diclofenac cream, which has helped some.  REVIEW OF SYSTEMS: Full 14 system review of systems performed and notable only for eye discharge, eye pain All other review of systems were negative.  ALLERGIES: Allergies  Allergen Reactions  . Amitriptyline     Very tired, fatigued  . Asa [Aspirin] Other (See Comments)    Stomach irritation  . Gabapentin     Dizzy, very tired  . Ibuprofen Other (See Comments)    Stomach irritation  . Latex     HOME MEDICATIONS: Current Outpatient Medications  Medication Sig Dispense Refill  . acetaminophen (TYLENOL) 325 MG tablet Take 2 tablets (650 mg total) by mouth every 6 (six) hours as needed for mild pain, moderate pain or fever. 60 tablet 0  . Calcium Carbonate-Vit D-Min (CALCIUM 1200 PO) Take 1,200 mg by mouth.    . cholecalciferol (VITAMIN D) 1000 UNITS tablet Take 1,000 Units by mouth daily.    Marland Kitchen denosumab (PROLIA) 60 MG/ML SOLN injection Inject 60 mg into the skin every 6 (six) months. Administer in upper arm, thigh, or abdomen    . diclofenac  sodium (VOLTAREN) 1 % GEL Apply 2 g topically 4 (four) times daily. 100 g 1  . Melatonin 3 MG TABS Take 1 each by mouth at bedtime.    . NONFORMULARY OR COMPOUNDED ITEM [facility]:  Antiinflammatory Cream - Diclofenac 3%, Baclofen 2%, Lidocaine 2%, apply 1-2 grams to affected area 3-4 times daily. 480 each 11  . Omega-3 Fatty Acids (FISH OIL) 1200 MG CAPS Take 1 capsule by mouth daily.    . silver sulfADIAZINE (SILVADENE) 1 % cream Apply 1 application topically  daily. 50 g 0  . simvastatin (ZOCOR) 10 MG tablet TAKE 1 TABLET BY MOUTH DAILY. 90 tablet 3  . TURMERIC PO Take 1,800 mg by mouth daily.     No current facility-administered medications for this visit.     PAST MEDICAL HISTORY: Past Medical History:  Diagnosis Date  . Hyperlipidemia   . Osteopenia     PAST SURGICAL HISTORY: Past Surgical History:  Procedure Laterality Date  . ABDOMINAL HYSTERECTOMY     age 8, NO BSO  . BREAST BIOPSY Bilateral 1996   benign  . BREAST SURGERY     biopsy x 2, benign  . BUNIONECTOMY Left   . Carpel tunnel    . FOOT SURGERY     total joint replacement- left foot after bunionectomy  . FOOT SURGERY Right    correct bunionectomy done previously  . HAMMER TOE SURGERY    . TENNIS ELBOW RELEASE/NIRSCHEL PROCEDURE      FAMILY HISTORY: Family History  Problem Relation Age of Onset  . Depression Mother   . Cancer Mother        breast s/p mastectomy  . Breast cancer Mother   . Alzheimer's disease Father   . Early death Maternal Grandfather 31       [place] Acc[place]    SOCIAL HISTORY: Social History   Socioeconomic History  . Marital status: Married    Spouse name: Not on file  . Number of children: Not on file  . Years of education: Not on file  . Highest education level: Not on file  Occupational History  . Not on file  Social Needs  . Financial resource strain: Not on file  . Food insecurity:    Worry: Not on file    Inability: Not on file  . Transportation needs:    Medical: Not on file    Non-medical: Not on file  Tobacco Use  . Smoking status: Former Smoker    Types: Cigarettes    Last attempt to quit: 12/14/1997    Years since quitting: 20.7  . Smokeless tobacco: Never Used  Substance and Sexual Activity  . Alcohol use: No  . Drug use: No  . Sexual activity: Not Currently  Lifestyle  . Physical activity:    Days per week: Not on file    Minutes per session: Not on file  . Stress: Not on file  Relationships  . Social  connections:    Talks on phone: Not on file    Gets together: Not on file    Attends religious service: Not on file    Active member of club or organization: Not on file    Attends meetings of clubs or organizations: Not on file    Relationship status: Not on file  . Intimate partner violence:    Fear of current or ex partner: Not on file    Emotionally abused: Not on file    Physically abused: Not on file  Forced sexual activity: Not on file  Other Topics Concern  . Not on file  Social History Narrative   Lives with husband   Caffeine use: Coffee- 3 cups per day      Right handed      PHYSICAL EXAM   Vitals:   08/26/18 0736  BP: 131/78  Pulse: 70  Weight: 149 lb 12 oz (67.9 kg)  Height: 5\' 3"  (1.6 m)    Not recorded      Body mass index is 26.53 kg/m.  PHYSICAL EXAMNIATION:  Gen: NAD, conversant, well nourised, obese, well groomed                     Cardiovascular: Regular rate rhythm, no peripheral edema, warm, nontender. Eyes: Conjunctivae clear without exudates or hemorrhage Neck: Supple, no carotid bruits. Pulmonary: Clear to auscultation bilaterally   NEUROLOGICAL EXAM:  MENTAL STATUS: Speech:    Speech is normal; fluent and spontaneous with normal comprehension.  Cognition:     Orientation to time, place and person     Normal recent and remote memory     Normal Attention span and concentration     Normal Language, naming, repeating,spontaneous speech     Fund of knowledge   CRANIAL NERVES: CN II: Visual fields are full to confrontation. Fundoscopic exam is normal with sharp discs and no vascular changes. Pupils are round equal and briskly reactive to light. CN III, IV, VI: extraocular movement are normal. No ptosis. CN V: Facial sensation is intact to pinprick in all 3 divisions bilaterally. Corneal responses are intact.  CN VII: Face is symmetric with normal eye closure and smile. CN VIII: Hearing is normal to rubbing fingers CN IX, X: Palate  elevates symmetrically. Phonation is normal. CN XI: Head turning and shoulder shrug are intact CN XII: Tongue is midline with normal movements and no atrophy.  MOTOR: Post surgical change of bilateral toes, no muscle weakness  REFLEXES: Reflexes are 2+ and symmetric at the biceps, triceps, knees, and absent at ankles. Plantar responses are flexor.  SENSORY: Length dependent decreased to light touch, pinprick and vibratory sensation to ankle level   COORDINATION: Rapid alternating movements and fine finger movements are intact. There is no dysmetria on finger-to-nose and heel-knee-shin.    GAIT/STANCE: Mildly antalgic, could walk on heels, but not on tip toe  Romberg is absent.   DIAGNOSTIC DATA (LABS, IMAGING, TESTING) - I reviewed patient records, labs, notes, testing and imaging myself where available.   ASSESSMENT AND PLAN  [name] is a 67 y.o. female   Bilateral lower extremity paresthesia History of osteoporosis, multiple compression fracture, chronic neck pain  Hyperreflexia on examination, less dependent sensory changes.  Need to rule out cervical spondylitic myelopathy, peripheral neuropathy  MRI of cervical spine  EMG nerve conduction study  Marcial Pacas, M.D. Ph.D.  [facility] [address] [place], [place] [address] Ph: [phone] Fax: [phone]  CC: Susy Frizzle, MD

## 2018-09-02 ENCOUNTER — Telehealth: Payer: Self-pay | Admitting: Neurology

## 2018-09-02 ENCOUNTER — Ambulatory Visit
Admission: RE | Admit: 2018-09-02 | Discharge: 2018-09-02 | Disposition: A | Discharge status: Home / Self Care | Payer: PPO | Source: Ambulatory Visit | Attending: Neurology | Admitting: Neurology

## 2018-09-02 DIAGNOSIS — R202 Paresthesia of skin: Secondary | ICD-10-CM

## 2018-09-02 DIAGNOSIS — M4802 Spinal stenosis, cervical region: Secondary | ICD-10-CM | POA: Diagnosis not present

## 2018-09-02 NOTE — Telephone Encounter (Signed)
Please call patient, MRI cervical spine showed evidence of multi-level degenerative changes, most severe at C4-5 level, there is moderate canal stenosis, also multilevel foraminal stenosis, I will review MRIs films at her next visit.   IMPRESSION: 1. Central canal stenosis is greatest at C4-5 with effacement of ventral CSF but no significant distortion of the spinal cord or abnormal cord signal. The canal is narrowed to 7.5 mm with moderate central canal stenosis. 2. Mild central canal narrowing with effacement the ventral CSF at C5-6. 3. Mild right foraminal narrowing at C3-4. 4. Moderate foraminal stenosis at C4-5 is worse on the right. 5. Severe right and moderate left foraminal narrowing at C5-6. 6. Moderate right and mild left foraminal stenosis at C6-7. 7. No other focal lesions to explain lower extremity symptoms.

## 2018-09-02 NOTE — Telephone Encounter (Signed)
Spoke to patient - she is aware of the MRI results.  She will keep her pending appt for NCV/EMG on 09/27/18 for further review.

## 2018-09-20 MED FILL — SHINGRIX 50 MCG SUS: 50 | 1 days supply | Qty: 1 | Fill #1

## 2018-09-27 ENCOUNTER — Ambulatory Visit (INDEPENDENT_AMBULATORY_CARE_PROVIDER_SITE_OTHER): Payer: PPO | Admitting: Neurology

## 2018-09-27 ENCOUNTER — Encounter: Payer: Self-pay | Admitting: Neurology

## 2018-09-27 DIAGNOSIS — R202 Paresthesia of skin: Secondary | ICD-10-CM

## 2018-09-27 DIAGNOSIS — M79673 Pain in unspecified foot: Secondary | ICD-10-CM

## 2018-09-27 MED ORDER — DULOXETINE HCL 60 MG PO CPEP: 60.0000 mg | ORAL_CAPSULE | Freq: Every day | ORAL | 12 refills | Status: DC

## 2018-09-27 MED FILL — DULoxetine HCL 60 MG CPEP: 60 | 30 days supply | Qty: 30 | Fill #0

## 2018-09-27 NOTE — Procedures (Signed)
        Full Name: [name] Gender: Female MRN #: [id number] Date of Birth: [birth year]    Visit Date: 09/27/18 09:09 Age: 67 Years 57 Months Old Examining Physician: Marcial Pacas, MD  Referring Physician: Krista Blue, MD History: 67 year old female presented with chronic neck pain, chronic bilateral feet paresthesia and deep achy pain.  Summary of the tests: Nerve Conduction study: Right sural, superficial peroneal sensory responses were normal.  Right peroneal to EDB and tibial motor responses were normal.  Electromyography: Selected needle examination of right lower extremity muscles and right lumbosacral paraspinal muscles were normal.   Conclusion: This is a normal study.  There is no electrodiagnostic evidence of large fiber peripheral neuropathy or right lumbosacral radiculopathy.    ------------------------------- Marcial Pacas, M.D. PhD  [facility] [place], [place] [address] Tel: [phone] Fax: [phone]        [facility]    Nerve / Sites Muscle Latency Ref. Amplitude Ref. Rel Amp Segments Distance Velocity Ref. Area    ms ms mV mV %  cm m/s m/s mVms  R Peroneal - EDB     Ankle EDB 4.0 ?6.5 4.8 ?2.0 100 Ankle - EDB 9   12.5     Fib head EDB 10.1  3.8  79.4 Fib head - Ankle 31 51 ?44 11.3     Pop fossa EDB 12.1  2.9  75.5 Pop fossa - Fib head 10 48 ?44 9.2         Pop fossa - Ankle      R Tibial - AH     Ankle AH 5.2 ?5.8 7.6 ?4.0 100 Ankle - AH 9   11.6     Pop fossa AH 12.6  3.3  43.5 Pop fossa - Ankle 38 52 ?41 8.4         SNC    Nerve / Sites Rec. Site Peak Lat Ref.  Amp Ref. Segments Distance    ms ms V V  cm  R Sural - Ankle (Calf)     Calf Ankle 3.4 ?4.4 8 ?6 Calf - Ankle 14  R Superficial peroneal - Ankle     Lat leg Ankle 3.3 ?4.4 7 ?6 Lat leg - Ankle 14         F  Wave    Nerve F Lat Ref.   ms ms  R Tibial - AH 50.9 ?56.0       EMG full       EMG Summary Table    Spontaneous MUAP Recruitment  Muscle IA Fib PSW  Fasc Other Amp Dur. Poly Pattern  R. Tibialis anterior Normal None None None _______ Normal Normal Normal Normal  R. Tibialis posterior Normal None None None _______ Normal Normal Normal Normal  R. Peroneus longus Normal None None None _______ Normal Normal Normal Normal  R. Gastrocnemius (Medial head) Normal None None None _______ Normal Normal Normal Normal  R. Vastus lateralis Normal None None None _______ Normal Normal Normal Normal  R. Lumbar paraspinals (mid) Normal None None None _______ Normal Normal Normal Normal  R. Lumbar paraspinals (low) Normal None None None _______ Normal Normal Normal Normal

## 2018-09-27 NOTE — Progress Notes (Signed)
PATIENT: [name] DOB: 1950-11-16  No chief complaint on file.    HISTORICAL  [name] is a 67 year old female, seen in request by her primary care doctor Jenna Luo for evaluation of peripheral neuropathy, initial evaluation was on August 26, 2018.  I have reviewed and summarized the referring note from the referring physician.  She had past medical history of hyperlipidemia, osteoporosis.  She had multiple foot surgery,  First surgery was on the left foot in late 1980s, she had left bunionectomy, but has trouble with the pin, has to has multiple recurrent surgery, then she began to have persistent left total pain, bottom of the feet numbness, she has to walk awkward,  She had a right bunionectomy in 2015, this did not solve her right foot pain, then she had multiple right foot hammertoe correction surgery, Baker's cyst in January 2018, this did not solve her problem either.  She continued to complains of bilateral plantar surface burning pain, multiple toe joint pain, difficulty walking,  Because of multiple surgery in 2018, she was not able to bear weight for 6 months, had acute worsening of osteoporosis, suffered multiple compression fracture of her left thigh, spine in June 2018, now taking Prolia, which has helped.  She had a history of chronic neck pain, reported abnormal MRI cervical in the past, also had bilateral carpal tunnel release surgery, had bilateral finger Raynolds phenomenon in cold weather, she has chronic low back pain, but denies radiating pain, she denies bowel and bladder incontinence.  Over the years, she has tried gabapentin, amitriptyline, did not help her symptoms, she cannot tolerate the side effect of drowsiness, dizziness, she is now using compounding cream and diclofenac cream, which has helped some.  UPDATE Sep 27 2018: She came for electrodiagnostic study today, which was essentially normal, in specific, there is no evidence of  large fiber peripheral neuropathy or right lumbosacral radiculopathy.  She continue complains of bilateral foot deep achy pain, getting worse after bearing weight, chronic neck pain, but denies bowel and bladder incontinence, some radiating pain to bilateral shoulder but denies significant upper extremity paresthesia or weakness.  We personally reviewed MRI of cervical spine in November 2019: Multilevel degenerative disease, most severe at C4-5 with central canal stenosis with enface meant of the ventral CSF, but there is no signal distortion of the spinal cord, variable degree of foraminal narrowing at different levels, severe at C5-6 right side moderate on the left side  MRI of lumbar spine in 2017, spondylosis most noticeable at L4-5 with ligamentum flavum thickening and a shallow disc causing mild central canal narrowing,  REVIEW OF SYSTEMS: Full 14 system review of systems performed and notable only for as above All other review of systems were negative.  ALLERGIES: Allergies  Allergen Reactions  . Amitriptyline     Very tired, fatigued  . Asa [Aspirin] Other (See Comments)    Stomach irritation  . Gabapentin     Dizzy, very tired  . Ibuprofen Other (See Comments)    Stomach irritation  . Latex     HOME MEDICATIONS: Current Outpatient Medications  Medication Sig Dispense Refill  . acetaminophen (TYLENOL) 325 MG tablet Take 2 tablets (650 mg total) by mouth every 6 (six) hours as needed for mild pain, moderate pain or fever. 60 tablet 0  . Calcium Carbonate-Vit D-Min (CALCIUM 1200 PO) Take 1,200 mg by mouth.    . cholecalciferol (VITAMIN D) 1000 UNITS tablet Take 1,000 Units by mouth daily.    Marland Kitchen  denosumab (PROLIA) 60 MG/ML SOLN injection Inject 60 mg into the skin every 6 (six) months. Administer in upper arm, thigh, or abdomen    . diclofenac sodium (VOLTAREN) 1 % GEL Apply 2 g topically 4 (four) times daily. 100 g 1  . Melatonin 3 MG TABS Take 1 each by mouth at bedtime.    .  NONFORMULARY OR COMPOUNDED ITEM [facility]:  Antiinflammatory Cream - Diclofenac 3%, Baclofen 2%, Lidocaine 2%, apply 1-2 grams to affected area 3-4 times daily. 480 each 11  . Omega-3 Fatty Acids (FISH OIL) 1200 MG CAPS Take 1 capsule by mouth daily.    . silver sulfADIAZINE (SILVADENE) 1 % cream Apply 1 application topically daily. 50 g 0  . simvastatin (ZOCOR) 10 MG tablet TAKE 1 TABLET BY MOUTH DAILY. 90 tablet 3  . TURMERIC PO Take 1,800 mg by mouth daily.     No current facility-administered medications for this visit.     PAST MEDICAL HISTORY: Past Medical History:  Diagnosis Date  . Hyperlipidemia   . Osteopenia     PAST SURGICAL HISTORY: Past Surgical History:  Procedure Laterality Date  . ABDOMINAL HYSTERECTOMY     age 8, NO BSO  . BREAST BIOPSY Bilateral 1996   benign  . BREAST SURGERY     biopsy x 2, benign  . BUNIONECTOMY Left   . Carpel tunnel    . FOOT SURGERY     total joint replacement- left foot after bunionectomy  . FOOT SURGERY Right    correct bunionectomy done previously  . HAMMER TOE SURGERY    . TENNIS ELBOW RELEASE/NIRSCHEL PROCEDURE      FAMILY HISTORY: Family History  Problem Relation Age of Onset  . Depression Mother   . Cancer Mother        breast s/p mastectomy  . Breast cancer Mother   . Alzheimer's disease Father   . Early death Maternal Grandfather 90       Mill Accident    SOCIAL HISTORY: Social History   Socioeconomic History  . Marital status: Married    Spouse name: Not on file  . Number of children: Not on file  . Years of education: Not on file  . Highest education level: Not on file  Occupational History  . Not on file  Social Needs  . Financial resource strain: Not on file  . Food insecurity:    Worry: Not on file    Inability: Not on file  . Transportation needs:    Medical: Not on file    Non-medical: Not on file  Tobacco Use  . Smoking status: Former Smoker    Types: Cigarettes    Last attempt to  quit: 12/14/1997    Years since quitting: 20.8  . Smokeless tobacco: Never Used  Substance and Sexual Activity  . Alcohol use: No  . Drug use: No  . Sexual activity: Not Currently  Lifestyle  . Physical activity:    Days per week: Not on file    Minutes per session: Not on file  . Stress: Not on file  Relationships  . Social connections:    Talks on phone: Not on file    Gets together: Not on file    Attends religious service: Not on file    Active member of club or organization: Not on file    Attends meetings of clubs or organizations: Not on file    Relationship status: Not on file  . Intimate partner violence:  Fear of current or ex partner: Not on file    Emotionally abused: Not on file    Physically abused: Not on file    Forced sexual activity: Not on file  Other Topics Concern  . Not on file  Social History Narrative   Lives with husband   Caffeine use: Coffee- 3 cups per day      Right handed      PHYSICAL EXAM   There were no vitals filed for this visit.  Not recorded      There is no height or weight on file to calculate BMI.  PHYSICAL EXAMNIATION:  Gen: NAD, conversant, well nourised, obese, well groomed                     Cardiovascular: Regular rate rhythm, no peripheral edema, warm, nontender. Eyes: Conjunctivae clear without exudates or hemorrhage Neck: Supple, no carotid bruits. Pulmonary: Clear to auscultation bilaterally   NEUROLOGICAL EXAM:  MENTAL STATUS: Speech:    Speech is normal; fluent and spontaneous with normal comprehension.  Cognition:     Orientation to time, place and person     Normal recent and remote memory     Normal Attention span and concentration     Normal Language, naming, repeating,spontaneous speech     Fund of knowledge   CRANIAL NERVES: CN II: Visual fields are full to confrontation.  Pupils are round equal and briskly reactive to light. CN III, IV, VI: extraocular movement are normal. No ptosis. CN V:  Facial sensation is intact to pinprick in all 3 divisions bilaterally. Corneal responses are intact.  CN VII: Face is symmetric with normal eye closure and smile. CN VIII: Hearing is normal to rubbing fingers CN IX, X: Palate elevates symmetrically. Phonation is normal. CN XI: Head turning and shoulder shrug are intact CN XII: Tongue is midline with normal movements and no atrophy.  MOTOR: Post surgical change of bilateral toes, no muscle weakness noted,  REFLEXES: Reflexes are 2+ and symmetric at the biceps, triceps, knees, and trace at ankles. Plantar responses are flexor.  SENSORY: Mildly length dependent decreased to light touch, pinprick and vibratory sensation to ankle level   COORDINATION: Rapid alternating movements and fine finger movements are intact. There is no dysmetria on finger-to-nose and heel-knee-shin.    GAIT/STANCE: Mildly antalgic, could walk on heels, but not on tip toe  Romberg is absent.   DIAGNOSTIC DATA (LABS, IMAGING, TESTING) - I reviewed patient records, labs, notes, testing and imaging myself where available.   ASSESSMENT AND PLAN  [name] is a 67 y.o. female   Bilateral foot deep achy pain paresthesia History of osteoporosis, multiple compression fracture, chronic neck pain  Hyperreflexia on examination, mildly length dependent sensory changes.  MRI of cervical spine showed multilevel degenerative changes, mild to moderate central canal stenosis at C4-5 no cord signal changes, hyperreflexia,  Her complaints of bilateral feet pain are likely combination of multiple previous foot surgery, mild cervical spondylitic myelopathy, no evidence of large fiber peripheral neuropathy  I have suggested her needed NSAIDs, add on Cymbalta 60 mg daily  Marcial Pacas, M.D. Ph.D.  [facility] [address], [place], [place] [address] Ph: [phone] Fax: [phone]  CC: Susy Frizzle, MD

## 2018-10-17 MED FILL — SIMVASTATIN 10 MG TABLET: 10 | 90 days supply | Qty: 90 | Fill #1

## 2018-11-27 DIAGNOSIS — R600 Localized edema: Secondary | ICD-10-CM | POA: Diagnosis not present

## 2019-01-06 ENCOUNTER — Ambulatory Visit (INDEPENDENT_AMBULATORY_CARE_PROVIDER_SITE_OTHER): Payer: PPO

## 2019-01-06 ENCOUNTER — Other Ambulatory Visit: Payer: Self-pay

## 2019-01-06 DIAGNOSIS — M810 Age-related osteoporosis without current pathological fracture: Secondary | ICD-10-CM | POA: Diagnosis not present

## 2019-01-06 MED ORDER — DENOSUMAB 60 MG/ML ~~LOC~~ SOSY
60.0000 mg | PREFILLED_SYRINGE | Freq: Once | SUBCUTANEOUS | Status: AC
  Administered 2019-01-06: 60 mg via SUBCUTANEOUS

## 2019-01-06 MED ORDER — DENOSUMAB 60 MG/ML ~~LOC~~ SOSY: 60.0000 mg | PREFILLED_SYRINGE | Freq: Once | SUBCUTANEOUS | 0 refills | Status: AC

## 2019-01-08 MED FILL — SIMVASTATIN 10 MG TABS: 10 | 90 days supply | Qty: 90 | Fill #0

## 2019-03-13 ENCOUNTER — Other Ambulatory Visit: Payer: Self-pay

## 2019-03-13 ENCOUNTER — Encounter: Payer: Self-pay | Admitting: Family Medicine

## 2019-03-13 ENCOUNTER — Ambulatory Visit (INDEPENDENT_AMBULATORY_CARE_PROVIDER_SITE_OTHER): Payer: PPO | Admitting: Family Medicine

## 2019-03-13 VITALS — BP 132/74 | Temp 98.8°F | Resp 16 | Ht 63.0 in | Wt 150.0 lb

## 2019-03-13 DIAGNOSIS — K529 Noninfective gastroenteritis and colitis, unspecified: Secondary | ICD-10-CM | POA: Diagnosis not present

## 2019-03-13 MED ORDER — METRONIDAZOLE 500 MG PO TABS: 500.0000 mg | ORAL_TABLET | Freq: Two times a day (BID) | ORAL | 0 refills | Status: DC

## 2019-03-13 MED ORDER — CIPROFLOXACIN HCL 500 MG PO TABS: 500.0000 mg | ORAL_TABLET | Freq: Two times a day (BID) | ORAL | 0 refills | Status: DC

## 2019-03-13 MED FILL — METRONIDAZOLE 500 MG TABS: 500 | 10 days supply | Qty: 20 | Fill #0

## 2019-03-13 MED FILL — CIPROFLOXACIN HCL 500 MG TA: 500 | 10 days supply | Qty: 20 | Fill #0

## 2019-03-13 NOTE — Progress Notes (Signed)
Subjective:    Patient ID: [name], female    DOB: [birth year], 68 y.o.   MRN: [id number]  HPI  Patient presents today with 2 weeks of left-sided abdominal pain.  Patient also reports subjective fevers and hot flashes.  She reports episodic sharp pain in her left lower quadrant.  She is also experienced frequent bouts of urgency and diarrhea and tenesmus.  She is also had some mild bloody diarrhea.  She had previous experience with this in 2013 when a CT scan revealed colitis.  She was told that she had ischemic colitis due to NSAID overuse.  The pain is similar however she has not had near the amount of bleeding that she experienced before.  She also reports nausea but no vomiting.  She denies any NSAID use.  She denies any melena but she has had occasional diarrhea with blood mixed with it. Past Medical History:  Diagnosis Date  . Hyperlipidemia   . Osteopenia    Past Surgical History:  Procedure Laterality Date  . ABDOMINAL HYSTERECTOMY     age 2, NO BSO  . BREAST BIOPSY Bilateral 1996   benign  . BREAST SURGERY     biopsy x 2, benign  . BUNIONECTOMY Left   . Carpel tunnel    . FOOT SURGERY     total joint replacement- left foot after bunionectomy  . FOOT SURGERY Right    correct bunionectomy done previously  . HAMMER TOE SURGERY    . TENNIS ELBOW RELEASE/NIRSCHEL PROCEDURE     Current Outpatient Medications on File Prior to Visit  Medication Sig Dispense Refill  . acetaminophen (TYLENOL) 325 MG tablet Take 2 tablets (650 mg total) by mouth every 6 (six) hours as needed for mild pain, moderate pain or fever. 60 tablet 0  . Calcium Carbonate-Vit D-Min (CALCIUM 1200 PO) Take 1,200 mg by mouth.    . cholecalciferol (VITAMIN D) 1000 UNITS tablet Take 1,000 Units by mouth daily.    Marland Kitchen denosumab (PROLIA) 60 MG/ML SOLN injection Inject 60 mg into the skin every 6 (six) months. Administer in upper arm, thigh, or abdomen    . diclofenac sodium (VOLTAREN) 1 % GEL Apply 2 g  topically 4 (four) times daily. 100 g 1  . DULoxetine (CYMBALTA) 60 MG capsule Take 1 capsule (60 mg total) by mouth daily. 30 capsule 12  . Melatonin 3 MG TABS Take 1 each by mouth at bedtime.    . NONFORMULARY OR COMPOUNDED ITEM [facility]:  Antiinflammatory Cream - Diclofenac 3%, Baclofen 2%, Lidocaine 2%, apply 1-2 grams to affected area 3-4 times daily. 480 each 11  . Omega-3 Fatty Acids (FISH OIL) 1200 MG CAPS Take 1 capsule by mouth daily.    . silver sulfADIAZINE (SILVADENE) 1 % cream Apply 1 application topically daily. 50 g 0  . simvastatin (ZOCOR) 10 MG tablet TAKE 1 TABLET BY MOUTH DAILY. 90 tablet 3  . TURMERIC PO Take 1,800 mg by mouth daily.     No current facility-administered medications on file prior to visit.    Allergies  Allergen Reactions  . Amitriptyline     Very tired, fatigued  . Asa [Aspirin] Other (See Comments)    Stomach irritation  . Gabapentin     Dizzy, very tired  . Ibuprofen Other (See Comments)    Stomach irritation  . Latex    Social History   Socioeconomic History  . Marital status: Married    Spouse name: Not on file  .  Number of children: Not on file  . Years of education: Not on file  . Highest education level: Not on file  Occupational History  . Not on file  Social Needs  . Financial resource strain: Not on file  . Food insecurity:    Worry: Not on file    Inability: Not on file  . Transportation needs:    Medical: Not on file    Non-medical: Not on file  Tobacco Use  . Smoking status: Former Smoker    Types: Cigarettes    Last attempt to quit: 12/14/1997    Years since quitting: 21.2  . Smokeless tobacco: Never Used  Substance and Sexual Activity  . Alcohol use: No  . Drug use: No  . Sexual activity: Not Currently  Lifestyle  . Physical activity:    Days per week: Not on file    Minutes per session: Not on file  . Stress: Not on file  Relationships  . Social connections:    Talks on phone: Not on file    Gets  together: Not on file    Attends religious service: Not on file    Active member of club or organization: Not on file    Attends meetings of clubs or organizations: Not on file    Relationship status: Not on file  . Intimate partner violence:    Fear of current or ex partner: Not on file    Emotionally abused: Not on file    Physically abused: Not on file    Forced sexual activity: Not on file  Other Topics Concern  . Not on file  Social History Narrative   Lives with husband   Caffeine use: Coffee- 3 cups per day      Right handed       Review of Systems  All other systems reviewed and are negative.      Objective:   Physical Exam Constitutional:      General: She is not in acute distress.    Appearance: Normal appearance. She is normal weight. She is not ill-appearing, toxic-appearing or diaphoretic.  Cardiovascular:     Rate and Rhythm: Normal rate and regular rhythm.     Heart sounds: Normal heart sounds. No murmur. No friction rub. No gallop.   Pulmonary:     Effort: Pulmonary effort is normal. No respiratory distress.     Breath sounds: Normal breath sounds. No stridor. No wheezing, rhonchi or rales.  Abdominal:     General: Abdomen is flat. Bowel sounds are normal. There is no distension.     Palpations: Abdomen is soft.     Tenderness: There is abdominal tenderness. There is no guarding or rebound.     Hernia: No hernia is present.    Neurological:     Mental Status: She is alert.           Assessment & Plan:  Colitis - Plan: CBC with Differential/Platelet, COMPLETE METABOLIC PANEL WITH GFR, Sedimentation rate  I feel that the patient has colitis.  Differential diagnosis includes infectious colitis versus inflammatory bowel disease such as ulcerative colitis.  We will empirically try treatment with Cipro 500 mg p.o. twice daily for 10 days and Flagyl 500 mg p.o. twice daily for 10 days.  If pain worsens or is not improving, proceed with a CT scan of the  abdomen and pelvis to evaluate further.  Consider GI consultation if patient has refractory colitis to rule out inflammatory bowel disease.

## 2019-04-15 DIAGNOSIS — C44622 Squamous cell carcinoma of skin of right upper limb, including shoulder: Secondary | ICD-10-CM | POA: Diagnosis not present

## 2019-04-15 DIAGNOSIS — D0462 Carcinoma in situ of skin of left upper limb, including shoulder: Secondary | ICD-10-CM | POA: Diagnosis not present

## 2019-04-15 DIAGNOSIS — L821 Other seborrheic keratosis: Secondary | ICD-10-CM | POA: Diagnosis not present

## 2019-04-15 DIAGNOSIS — Z85828 Personal history of other malignant neoplasm of skin: Secondary | ICD-10-CM | POA: Diagnosis not present

## 2019-04-15 DIAGNOSIS — L570 Actinic keratosis: Secondary | ICD-10-CM | POA: Diagnosis not present

## 2019-04-17 MED FILL — SIMVASTATIN 10 MG TABLET: 10 | 90 days supply | Qty: 90 | Fill #0

## 2019-04-23 ENCOUNTER — Ambulatory Visit: Payer: PPO | Admitting: Podiatry

## 2019-04-23 ENCOUNTER — Other Ambulatory Visit: Payer: Self-pay

## 2019-04-23 ENCOUNTER — Ambulatory Visit (INDEPENDENT_AMBULATORY_CARE_PROVIDER_SITE_OTHER): Payer: PPO

## 2019-04-23 DIAGNOSIS — T8484XA Pain due to internal orthopedic prosthetic devices, implants and grafts, initial encounter: Secondary | ICD-10-CM

## 2019-04-23 DIAGNOSIS — M2041 Other hammer toe(s) (acquired), right foot: Secondary | ICD-10-CM

## 2019-04-23 DIAGNOSIS — M778 Other enthesopathies, not elsewhere classified: Secondary | ICD-10-CM

## 2019-04-23 DIAGNOSIS — M779 Enthesopathy, unspecified: Secondary | ICD-10-CM | POA: Diagnosis not present

## 2019-04-23 DIAGNOSIS — G5791 Unspecified mononeuropathy of right lower limb: Secondary | ICD-10-CM | POA: Diagnosis not present

## 2019-04-23 MED ORDER — DOXYCYCLINE HYCLATE 100 MG PO TABS: 100.0000 mg | ORAL_TABLET | Freq: Two times a day (BID) | ORAL | 0 refills | Status: DC

## 2019-04-23 MED FILL — DOXYCYCLINE HYCLATE 100 MG: 100 | 10 days supply | Qty: 20 | Fill #0

## 2019-04-23 NOTE — Progress Notes (Signed)
   HPI: Patient presents the office today after not being seen for several months regarding right foot pain.  Patient was initially scheduled for surgery in August 2019, however since her last visit her husband has been very ill and she has been taking care of him.  The patient initially had surgery to correct for hammertoes however she has had some residual hammertoe deformity due to elongation of the digits.  She most recently developed a blister to the dorsal aspect of the right third toe.  She continues to have pain and tenderness without any significant changes over the last 8 months since last visit.  She presents today to proceed with surgery and for a renewed surgical consult  Past Medical History:  Diagnosis Date  . Hyperlipidemia   . Osteopenia      Physical Exam: General: The patient is alert and oriented x3 in no acute distress.  Dermatology: Skin is warm, dry and supple bilateral lower extremities. Negative for open lesions or macerations.  Hyperkeratotic discoloration noted to the right great toenail plate.  There is some pain on palpation also noted to the nail plate.  There is a very well intact serosanguineous blister approximately 1 cm in diameter to the dorsal aspect of the right third toe.  No erythema noted.   Vascular: Palpable pedal pulses bilaterally. No edema or erythema noted. Capillary refill within normal limits.  Neurological: Epicritic and protective threshold grossly intact bilaterally.  Positive Tinel sign noted with percussion overlying the first MTPJ likely consistent with a neuritis of the dorsal medial cutaneous nerve.  Paresthesia also noted.  Musculoskeletal Exam: Pain on palpation noted to the PIPJ of the fourth digit right foot consistent with a toe capsulitis.  Patient has had recent PIPJ arthroplasty performed at the site.  Elongated toes noted digits 2, 3 of the right foot as well with lateral deviation at the  arthroplasty sites  As evident on  radiographic exam   Assessment: 1. Neuritis dorsal medial cutaneous nerve right foot 2. Elongated digits 2, 3 right foot with contracture 3. Toe capsulitis fourth digit right foot 4.  Stable blister right third toe, idiopathic   Plan of Care:  1. Patient was evaluated today.   2. Today we discussed the conservative versus surgical management of the presenting pathology. The patient opts for surgical management. All possible complications and details of the procedure were explained. All patient questions were answered. No guarantees were expressed or implied. 3. Authorization for surgery was re-initiated today. Surgery will consist of neural lysis dorsal medial cutaneous nerve right foot.  Removal of hardware first MTPJ right foot.  Arthroplasty digits 2, 3, 4 right foot with internal screw fixation. 4. Return to clinic one week post op.   Edrick Kins, DPM [facility]  Dr. Edrick Kins, DPM    [address], [place] [address]                Office [phone]  Fax [phone]

## 2019-04-23 NOTE — Patient Instructions (Signed)
Pre-Operative Instructions  Congratulations, you have decided to take an important step towards improving your quality of life.  You can be assured that the doctors and staff at [facility] will be with you every step of the way.  Here are some important things you should know:  1. Plan to be at the surgery center/hospital at least 1 (one) hour prior to your scheduled time, unless otherwise directed by the surgical center/hospital staff.  You must have a responsible adult accompany you, remain during the surgery and drive you home.  Make sure you have directions to the surgical center/hospital to ensure you arrive on time. 2. If you are having surgery at [facility] or [facility], you will need a copy of your medical history and physical form from your family physician within one month prior to the date of surgery. We will give you a form for your primary physician to complete.  3. We make every effort to accommodate the date you request for surgery.  However, there are times where surgery dates or times have to be moved.  We will contact you as soon as possible if a change in schedule is required.   4. No aspirin/ibuprofen for one week before surgery.  If you are on aspirin, any non-steroidal anti-inflammatory medications (Mobic, Aleve, Ibuprofen) should not be taken seven (7) days prior to your surgery.  You make take Tylenol for pain prior to surgery.  5. Medications - If you are taking daily heart and blood pressure medications, seizure, reflux, allergy, asthma, anxiety, pain or diabetes medications, make sure you notify the surgery center/hospital before the day of surgery so they can tell you which medications you should take or avoid the day of surgery. 6. No food or drink after midnight the night before surgery unless directed otherwise by surgical center/hospital staff. 7. No alcoholic beverages 24-hours prior to surgery.  No smoking 24-hours prior or 24-hours after  surgery. 8. Wear loose pants or shorts. They should be loose enough to fit over bandages, boots, and casts. 9. Don't wear slip-on shoes. Sneakers are preferred. 10. Bring your boot with you to the surgery center/hospital.  Also bring crutches or a walker if your physician has prescribed it for you.  If you do not have this equipment, it will be provided for you after surgery. 11. If you have not been contacted by the surgery center/hospital by the day before your surgery, call to confirm the date and time of your surgery. 12. Leave-time from work may vary depending on the type of surgery you have.  Appropriate arrangements should be made prior to surgery with your employer. 13. Prescriptions will be provided immediately following surgery by your doctor.  Fill these as soon as possible after surgery and take the medication as directed. Pain medications will not be refilled on weekends and must be approved by the doctor. 14. Remove nail polish on the operative foot and avoid getting pedicures prior to surgery. 15. Wash the night before surgery.  The night before surgery wash the foot and leg well with water and the antibacterial soap provided. Be sure to pay special attention to beneath the toenails and in between the toes.  Wash for at least three (3) minutes. Rinse thoroughly with water and dry well with a towel.  Perform this wash unless told not to do so by your physician.  Enclosed: 1 Ice pack (please put in freezer the night before surgery)   1 Hibiclens skin cleaner     Pre-op instructions  If you have any questions regarding the instructions, please do not hesitate to call our office.  [place]: [address], [place], [place] [address] -- [phone]  [employer]: [address] [place], [place] [address] -- [phone]  [place]: [address]  [place], [place] [address] -- [phone]  [place]: [address], [address], [place], [place] [address] -- [phone]  Website:  [email or web address] 

## 2019-05-09 ENCOUNTER — Encounter: Payer: Self-pay | Admitting: Family Medicine

## 2019-05-09 DIAGNOSIS — Z803 Family history of malignant neoplasm of breast: Secondary | ICD-10-CM | POA: Diagnosis not present

## 2019-05-09 DIAGNOSIS — Z1231 Encounter for screening mammogram for malignant neoplasm of breast: Secondary | ICD-10-CM | POA: Diagnosis not present

## 2019-05-16 ENCOUNTER — Telehealth: Payer: Self-pay

## 2019-05-16 ENCOUNTER — Encounter: Payer: Self-pay | Admitting: Family Medicine

## 2019-05-16 DIAGNOSIS — Z1231 Encounter for screening mammogram for malignant neoplasm of breast: Secondary | ICD-10-CM | POA: Diagnosis not present

## 2019-05-16 DIAGNOSIS — Z803 Family history of malignant neoplasm of breast: Secondary | ICD-10-CM | POA: Diagnosis not present

## 2019-05-16 DIAGNOSIS — R922 Inconclusive mammogram: Secondary | ICD-10-CM | POA: Diagnosis not present

## 2019-05-16 NOTE — Telephone Encounter (Signed)
Faxed for to [facility].

## 2019-06-02 ENCOUNTER — Telehealth: Payer: Self-pay | Admitting: *Deleted

## 2019-06-02 NOTE — Telephone Encounter (Signed)
DOS 06/12/2019; REMOVAL FIXATION DEEP KWIRE/SCREW, NEUROLYSIS OF NERVE - 64704, AND  HAMMER TOE REPAIR 2 ,3, 4 - 74128 X 3 RIGHT FOOT  AUTHORIZATION # [id number] Valid from 06/12/2019 to 09/10/2019

## 2019-06-12 ENCOUNTER — Encounter: Payer: Self-pay | Admitting: Podiatry

## 2019-06-12 ENCOUNTER — Other Ambulatory Visit: Payer: Self-pay | Admitting: Podiatry

## 2019-06-12 DIAGNOSIS — M792 Neuralgia and neuritis, unspecified: Secondary | ICD-10-CM | POA: Diagnosis not present

## 2019-06-12 DIAGNOSIS — E039 Hypothyroidism, unspecified: Secondary | ICD-10-CM | POA: Diagnosis not present

## 2019-06-12 DIAGNOSIS — M2041 Other hammer toe(s) (acquired), right foot: Secondary | ICD-10-CM | POA: Diagnosis not present

## 2019-06-12 DIAGNOSIS — G5791 Unspecified mononeuropathy of right lower limb: Secondary | ICD-10-CM | POA: Diagnosis not present

## 2019-06-12 DIAGNOSIS — Z4889 Encounter for other specified surgical aftercare: Secondary | ICD-10-CM | POA: Diagnosis not present

## 2019-06-12 DIAGNOSIS — G5761 Lesion of plantar nerve, right lower limb: Secondary | ICD-10-CM | POA: Diagnosis not present

## 2019-06-12 DIAGNOSIS — T84293A Other mechanical complication of internal fixation device of bones of foot and toes, initial encounter: Secondary | ICD-10-CM | POA: Diagnosis not present

## 2019-06-12 MED ORDER — OXYCODONE-ACETAMINOPHEN 5-325 MG PO TABS: 1.0000 | ORAL_TABLET | Freq: Four times a day (QID) | ORAL | 0 refills | Status: DC | PRN

## 2019-06-12 MED FILL — OXYCODONE-ACETAMINOPHEN 5-3: 5-325 | 7 days supply | Qty: 30 | Fill #0

## 2019-06-12 NOTE — Progress Notes (Signed)
.  postop

## 2019-06-16 ENCOUNTER — Ambulatory Visit (INDEPENDENT_AMBULATORY_CARE_PROVIDER_SITE_OTHER): Payer: PPO | Admitting: Podiatry

## 2019-06-16 ENCOUNTER — Ambulatory Visit (INDEPENDENT_AMBULATORY_CARE_PROVIDER_SITE_OTHER): Payer: PPO

## 2019-06-16 ENCOUNTER — Encounter: Payer: Self-pay | Admitting: Podiatry

## 2019-06-16 ENCOUNTER — Other Ambulatory Visit: Payer: Self-pay

## 2019-06-16 DIAGNOSIS — M2041 Other hammer toe(s) (acquired), right foot: Secondary | ICD-10-CM

## 2019-06-16 DIAGNOSIS — Z9889 Other specified postprocedural states: Secondary | ICD-10-CM

## 2019-06-16 DIAGNOSIS — M778 Other enthesopathies, not elsewhere classified: Secondary | ICD-10-CM

## 2019-06-16 DIAGNOSIS — M779 Enthesopathy, unspecified: Secondary | ICD-10-CM

## 2019-06-18 NOTE — Progress Notes (Signed)
   Subjective:  Patient presents today status post right forefoot surgery. DOS: 06/12/2019. She states she is doing well. She denies any significant pain or modifying factors. She has been using the CAM boot as directed. Patient is here for further evaluation and treatment.    Past Medical History:  Diagnosis Date  . Hyperlipidemia   . Osteopenia       Objective/Physical Exam Neurovascular status intact.  Skin incisions appear to be well coapted with sutures and staples intact. No sign of infectious process noted. No dehiscence. No active bleeding noted. Moderate edema noted to the surgical extremity.  Radiographic Exam:  Orthopedic hardware and osteotomies sites appear to be stable with routine healing.  Assessment: 1. s/p right forefoot surgery. DOS: 06/12/2019   Plan of Care:  1. Patient was evaluated. X-rays reviewed 2. Dressing changed. Keep clean, dry and intact for one week.  3. Continue using CAM boot.  4. Return to clinic in one week.    Edrick Kins, DPM [facility]  Dr. Edrick Kins, [place]                                        [place], [place] [address]                Office [phone]  Fax [phone]

## 2019-06-25 ENCOUNTER — Other Ambulatory Visit: Payer: Self-pay

## 2019-06-25 ENCOUNTER — Ambulatory Visit (INDEPENDENT_AMBULATORY_CARE_PROVIDER_SITE_OTHER): Payer: Self-pay | Admitting: Podiatry

## 2019-06-25 DIAGNOSIS — M778 Other enthesopathies, not elsewhere classified: Secondary | ICD-10-CM

## 2019-06-25 DIAGNOSIS — Z9889 Other specified postprocedural states: Secondary | ICD-10-CM

## 2019-06-25 DIAGNOSIS — M2041 Other hammer toe(s) (acquired), right foot: Secondary | ICD-10-CM

## 2019-06-25 DIAGNOSIS — M779 Enthesopathy, unspecified: Secondary | ICD-10-CM

## 2019-06-28 NOTE — Progress Notes (Signed)
   Subjective:  Patient presents today status post right forefoot surgery. DOS: 06/12/2019. She states she is doing well. She denies any pain or modifying factors. She has been using the CAM boot as directed. Patient is here for further evaluation and treatment.    Past Medical History:  Diagnosis Date  . Hyperlipidemia   . Osteopenia       Objective/Physical Exam Neurovascular status intact.  Skin incisions appear to be well coapted with sutures and staples intact. No sign of infectious process noted. No dehiscence. No active bleeding noted. Moderate edema noted to the surgical extremity.  Assessment: 1. s/p right forefoot surgery. DOS: 06/12/2019   Plan of Care:  1. Patient was evaluated.  2. Sutures removed.   3. Continue using CAM boot.  4. Return to clinic in 2 weeks for pin removal.    Edrick Kins, DPM [facility]  Dr. Edrick Kins, [place]                                        [place], [place] [address]                Office [phone]  Fax [phone]

## 2019-07-08 ENCOUNTER — Other Ambulatory Visit: Payer: Self-pay | Admitting: Family Medicine

## 2019-07-09 ENCOUNTER — Other Ambulatory Visit: Payer: Self-pay

## 2019-07-09 ENCOUNTER — Ambulatory Visit (INDEPENDENT_AMBULATORY_CARE_PROVIDER_SITE_OTHER): Payer: PPO

## 2019-07-09 ENCOUNTER — Ambulatory Visit (INDEPENDENT_AMBULATORY_CARE_PROVIDER_SITE_OTHER): Payer: PPO | Admitting: Podiatry

## 2019-07-09 DIAGNOSIS — Z9889 Other specified postprocedural states: Secondary | ICD-10-CM

## 2019-07-09 DIAGNOSIS — M2041 Other hammer toe(s) (acquired), right foot: Secondary | ICD-10-CM

## 2019-07-09 MED FILL — SIMVASTATIN 10 MG TABLET: 10 | 90 days supply | Qty: 90 | Fill #0

## 2019-07-10 ENCOUNTER — Ambulatory Visit (INDEPENDENT_AMBULATORY_CARE_PROVIDER_SITE_OTHER): Payer: PPO | Admitting: *Deleted

## 2019-07-10 DIAGNOSIS — M810 Age-related osteoporosis without current pathological fracture: Secondary | ICD-10-CM | POA: Diagnosis not present

## 2019-07-10 MED ORDER — DENOSUMAB 60 MG/ML ~~LOC~~ SOSY
60.0000 mg | PREFILLED_SYRINGE | Freq: Once | SUBCUTANEOUS | Status: AC
  Administered 2019-07-10: 60 mg via SUBCUTANEOUS

## 2019-07-12 NOTE — Progress Notes (Signed)
   Subjective:  Patient presents today status post right forefoot surgery. DOS: 06/12/2019. She states she is doing well. She expresses some concern because the pin in the right fourth toe turned. She denies any pain or modifying factors. She denies any complaints concerning the other toes. Patient is here for further evaluation and treatment.     Past Medical History:  Diagnosis Date  . Hyperlipidemia   . Osteopenia       Objective/Physical Exam Neurovascular status intact.  Skin incisions appear to be well coapted. No sign of infectious process noted. No dehiscence. No active bleeding noted. Moderate edema noted to the surgical extremity.  Radiographic Exam:  Orthopedic hardware and osteotomies sites appear to be stable with routine healing.   Assessment: 1. s/p right forefoot surgery. DOS: 06/12/2019   Plan of Care:  1. Patient was evaluated. X-Rays reviewed.  2. Percutaneous pins removed.  3. Transition out of post op shoe into good shoe gear.  4. Return to clinic in 4 weeks for final follow up X-Ray.    Edrick Kins, DPM [facility]  Dr. Edrick Kins, [place]                                        [place], [place] [address]                Office [phone]  Fax [phone]

## 2019-07-28 ENCOUNTER — Ambulatory Visit (INDEPENDENT_AMBULATORY_CARE_PROVIDER_SITE_OTHER): Payer: PPO | Admitting: Orthopedic Surgery

## 2019-07-28 ENCOUNTER — Ambulatory Visit (INDEPENDENT_AMBULATORY_CARE_PROVIDER_SITE_OTHER): Payer: PPO

## 2019-07-28 ENCOUNTER — Other Ambulatory Visit: Payer: Self-pay

## 2019-07-28 DIAGNOSIS — H524 Presbyopia: Secondary | ICD-10-CM | POA: Diagnosis not present

## 2019-07-28 DIAGNOSIS — M79641 Pain in right hand: Secondary | ICD-10-CM

## 2019-07-28 DIAGNOSIS — M654 Radial styloid tenosynovitis [de Quervain]: Secondary | ICD-10-CM

## 2019-07-28 DIAGNOSIS — H5203 Hypermetropia, bilateral: Secondary | ICD-10-CM | POA: Diagnosis not present

## 2019-07-28 DIAGNOSIS — H52221 Regular astigmatism, right eye: Secondary | ICD-10-CM | POA: Diagnosis not present

## 2019-07-28 DIAGNOSIS — H25093 Other age-related incipient cataract, bilateral: Secondary | ICD-10-CM | POA: Diagnosis not present

## 2019-07-29 ENCOUNTER — Other Ambulatory Visit: Payer: PPO

## 2019-07-29 ENCOUNTER — Encounter: Payer: Self-pay | Admitting: Orthopedic Surgery

## 2019-07-29 DIAGNOSIS — K529 Noninfective gastroenteritis and colitis, unspecified: Secondary | ICD-10-CM | POA: Diagnosis not present

## 2019-07-29 DIAGNOSIS — E7800 Pure hypercholesterolemia, unspecified: Secondary | ICD-10-CM

## 2019-07-29 DIAGNOSIS — M654 Radial styloid tenosynovitis [de Quervain]: Secondary | ICD-10-CM | POA: Diagnosis not present

## 2019-07-29 MED ORDER — METHYLPREDNISOLONE ACETATE 40 MG/ML IJ SUSP
40.0000 mg | INTRAMUSCULAR | Status: AC | PRN
  Administered 2019-07-29: 40 mg

## 2019-07-29 MED ORDER — LIDOCAINE HCL 1 % IJ SOLN
1.0000 mL | INTRAMUSCULAR | Status: AC | PRN
  Administered 2019-07-29: 1 mL

## 2019-07-29 NOTE — Progress Notes (Signed)
Office Visit Note   Patient: [name]           Date of Birth: Jul 10, 1951           MRN: [id number] Visit Date: 07/28/2019              Requested by: Susy Frizzle, MD [address] [place],  [place] [address] PCP: Susy Frizzle, MD  Chief Complaint  Patient presents with  . Right Hand - Pain      HPI: Patient is a 68 year old woman who complains of dorsal right thumb pain that is been going on for about a month.  Patient states she has had a history of carpal tunnel syndrome for which she underwent surgery she states she currently cannot lift anything or hold anything with her right hand.  Patient states she has swelling pain with movement she is taken anti-inflammatories and Tylenol without relief.  Patient states she is 6 weeks out from bunion surgery where she just had hardware removed which she states her body rejected.  Assessment & Plan: Visit Diagnoses:  1. Pain in right hand   2. De Quervain's disease (tenosynovitis)     Plan: First dorsal extensor compartment was injected she tolerated this well discussed that she can use a neoprene wrist splint to decrease irritation to the first dorsal extensor compartment follow-up as needed.  Follow-Up Instructions: Return if symptoms worsen or fail to improve.   Ortho Exam  Patient is alert, oriented, no adenopathy, well-dressed, normal affect, normal respiratory effort. Examination of the right upper extremity patient has no pain to palpation over the scaphoid scapholunate or TFCC.  She has full range of motion of all fingers.  She is point tender to palpation over the first dorsal extensor compartment and this reproduces her pain a Finkelstein's test is positive.  Imaging: Xr Hand Complete Right  Result Date: 07/29/2019 3 view radiographs of the right hand shows no bony abnormalities.  No images are attached to the encounter.  Labs: Lab Results  Component Value Date   LABORGA  09/28/2016   Normal Upper Respiratory Flora No Beta Hemolytic Streptococci Isolated      Lab Results  Component Value Date   ALBUMIN 4.6 06/19/2017   ALBUMIN 4.8 05/29/2016   ALBUMIN 4.6 04/22/2015    No results found for: MG Lab Results  Component Value Date   VD25OH 61 07/04/2018   VD25OH 49 04/16/2017    No results found for: PREALBUMIN CBC EXTENDED Latest Ref Rng & Units 07/04/2018 06/19/2017 05/29/2016  WBC 3.8 - 10.8 Thousand/uL 5.3 7.7 7.3  RBC 3.80 - 5.10 Million/uL 4.28 4.12 4.27  HGB 11.7 - 15.5 g/dL 13.3 12.8 13.4  HCT 35.0 - 45.0 % 39.0 39.5 40.9  PLT 140 - 400 Thousand/uL 318 388 348  NEUTROABS 1,500 - 7,800 cells/uL 2,793 5,005 4,453  LYMPHSABS 850 - 3,900 cells/uL 1,712 2,079 2,117     There is no height or weight on file to calculate BMI.  Orders:  Orders Placed This Encounter  Procedures  . XR Hand Complete Right   No orders of the defined types were placed in this encounter.    Procedures: Hand/UE Inj: R extensor compartment 1 for de Quervain's tenosynovitis on 07/29/2019 10:09 AM Indications: therapeutic and diagnostic Details: 22 G needle Medications: 1 mL lidocaine 1 %; 40 mg methylPREDNISolone acetate 40 MG/ML Outcome: tolerated well, no immediate complications Procedure, treatment alternatives, risks and benefits explained, specific risks discussed. Consent  was given by the patient. Immediately prior to procedure a time out was called to verify the correct patient, procedure, equipment, support staff and site/side marked as required. Patient was prepped and draped in the usual sterile fashion.      Clinical Data: No additional findings.  ROS:  All other systems negative, except as noted in the HPI. Review of Systems  Objective: Vital Signs: There were no vitals taken for this visit.  Specialty Comments:  No specialty comments available.  PMFS History: Patient Active Problem List   Diagnosis Date Noted  . Paresthesia 08/26/2018  . Breast  pain, left 04/11/2017  . Osteoporosis 05/24/2015  . Osteopenia   . Hyperlipidemia    Past Medical History:  Diagnosis Date  . Hyperlipidemia   . Osteopenia     Family History  Problem Relation Age of Onset  . Depression Mother   . Cancer Mother        breast s/p mastectomy  . Breast cancer Mother   . Alzheimer's disease Father   . Early death Maternal Grandfather 96       [place] Accident    Past Surgical History:  Procedure Laterality Date  . ABDOMINAL HYSTERECTOMY     age 62, NO BSO  . BREAST BIOPSY Bilateral 1996   benign  . BREAST SURGERY     biopsy x 2, benign  . BUNIONECTOMY Left   . Carpel tunnel    . FOOT SURGERY     total joint replacement- left foot after bunionectomy  . FOOT SURGERY Right    correct bunionectomy done previously  . HAMMER TOE SURGERY    . TENNIS ELBOW RELEASE/NIRSCHEL PROCEDURE     Social History   Occupational History  . Not on file  Tobacco Use  . Smoking status: Former Smoker    Types: Cigarettes    Quit date: 12/14/1997    Years since quitting: 21.6  . Smokeless tobacco: Never Used  Substance and Sexual Activity  . Alcohol use: No  . Drug use: No  . Sexual activity: Not Currently

## 2019-07-30 LAB — COMPLETE METABOLIC PANEL WITH GFR
AG Ratio: 1.6 (calc) (ref 1.0–2.5)
ALT: 13 U/L (ref 6–29)
AST: 17 U/L (ref 10–35)
Albumin: 4.6 g/dL (ref 3.6–5.1)
Alkaline phosphatase (APISO): 57 U/L (ref 37–153)
BUN: 9 mg/dL (ref 7–25)
CO2: 25 mmol/L (ref 20–32)
Calcium: 10.3 mg/dL (ref 8.6–10.4)
Chloride: 101 mmol/L (ref 98–110)
Creat: 0.61 mg/dL (ref 0.50–0.99)
GFR, Est African American: 107 mL/min/{1.73_m2} (ref >=60)
GFR, Est Non African American: 92 mL/min/{1.73_m2} (ref >=60)
Globulin: 2.9 g/dL (calc) (ref 1.9–3.7)
Glucose, Bld: 89 mg/dL (ref 65–99)
Potassium: 5.6 mmol/L — ABNORMAL HIGH (ref 3.5–5.3)
Sodium: 139 mmol/L (ref 135–146)
Total Bilirubin: 0.6 mg/dL (ref 0.2–1.2)
Total Protein: 7.5 g/dL (ref 6.1–8.1)

## 2019-07-30 LAB — CBC WITH DIFFERENTIAL/PLATELET
Absolute Monocytes: 838 cells/uL (ref 200–950)
Basophils Absolute: 61 cells/uL (ref 0–200)
Basophils Relative: 0.6 %
Eosinophils Absolute: 40 cells/uL (ref 15–500)
Eosinophils Relative: 0.4 %
HCT: 40.3 % (ref 35.0–45.0)
Hemoglobin: 13.2 g/dL (ref 11.7–15.5)
Lymphs Abs: 2636 cells/uL (ref 850–3900)
MCH: 30.9 pg (ref 27.0–33.0)
MCHC: 32.8 g/dL (ref 32.0–36.0)
MCV: 94.4 fL (ref 80.0–100.0)
MPV: 11.3 fL (ref 7.5–12.5)
Monocytes Relative: 8.3 %
Neutro Abs: 6525 cells/uL (ref 1500–7800)
Neutrophils Relative %: 64.6 %
Platelets: 366 10*3/uL (ref 140–400)
RBC: 4.27 10*6/uL (ref 3.80–5.10)
RDW: 12.1 % (ref 11.0–15.0)
Total Lymphocyte: 26.1 %
WBC: 10.1 10*3/uL (ref 3.8–10.8)

## 2019-07-30 LAB — LIPID PANEL
Cholesterol: 208 mg/dL — ABNORMAL HIGH (ref <200)
HDL: 64 mg/dL (ref >=50)
LDL Cholesterol (Calc): 113 mg/dL (calc) — ABNORMAL HIGH
Non-HDL Cholesterol (Calc): 144 mg/dL (calc) — ABNORMAL HIGH (ref <130)
Total CHOL/HDL Ratio: 3.3 (calc) (ref <5.0)
Triglycerides: 193 mg/dL — ABNORMAL HIGH (ref <150)

## 2019-07-30 LAB — SEDIMENTATION RATE: Sed Rate: 17 mm/h (ref 0–30)

## 2019-07-31 ENCOUNTER — Ambulatory Visit (INDEPENDENT_AMBULATORY_CARE_PROVIDER_SITE_OTHER): Payer: PPO | Admitting: Family Medicine

## 2019-07-31 ENCOUNTER — Other Ambulatory Visit: Payer: Self-pay

## 2019-07-31 VITALS — BP 120/68 | HR 89 | Temp 97.9°F | Resp 18 | Ht 63.0 in | Wt 155.0 lb

## 2019-07-31 DIAGNOSIS — Z0000 Encounter for general adult medical examination without abnormal findings: Secondary | ICD-10-CM | POA: Diagnosis not present

## 2019-07-31 DIAGNOSIS — Z23 Encounter for immunization: Secondary | ICD-10-CM

## 2019-07-31 DIAGNOSIS — M818 Other osteoporosis without current pathological fracture: Secondary | ICD-10-CM

## 2019-07-31 DIAGNOSIS — E7800 Pure hypercholesterolemia, unspecified: Secondary | ICD-10-CM

## 2019-07-31 DIAGNOSIS — G609 Hereditary and idiopathic neuropathy, unspecified: Secondary | ICD-10-CM

## 2019-07-31 NOTE — Progress Notes (Signed)
Subjective:    Patient ID: [name], female    DOB: August 07, 1951, 68 y.o. y.o.   MRN: [id number]  HPI Patient is a very pleasant 68 year old white female who is here today for complete physical exam.  Patient is currently on Prolia for osteoporosis.  Her bone density is due. She had Cologuard performed in 2018 for colon cancer screening that was normal.  This is good for an additional 1 year.  Past medical history significant for hysterectomy and therefore she does not require Pap smear.  Her immunizations are up-to-date except for her flu shot which she would like to receive today.  Shingles vaccine was received last year.  Patient is concerned by tender nodule at the inferior portion of her sternum right where the ribs join the bottom of the sternum.  She states that she can feel a hard spot.  When I palpate the area feels like a xiphoid process.  There is no swelling or deformity seen in that area.  Otherwise she is doing well with no concerns. Immunization History  Administered Date(s) Administered  . Influenza, High Dose Seasonal PF 06/14/2016  . Influenza,inj,Quad PF,6+ Mos 07/15/2015, 06/19/2017, 07/04/2018  . Influenza-Unspecified 07/16/2014  . Pneumococcal Conjugate-13 05/29/2016  . Pneumococcal Polysaccharide-23 12/15/2014  . Tdap 03/16/2014  . Zoster Recombinat (Shingrix) 07/19/2018, 09/23/2018    Past Medical History:  Diagnosis Date  . Hyperlipidemia   . Osteopenia    Past Surgical History:  Procedure Laterality Date  . ABDOMINAL HYSTERECTOMY     age 68, NO BSO  . BREAST BIOPSY Bilateral 1996   benign  . BREAST SURGERY     biopsy x 2, benign  . BUNIONECTOMY Left   . Carpel tunnel    . FOOT SURGERY     total joint replacement- left foot after bunionectomy  . FOOT SURGERY Right    correct bunionectomy done previously  . HAMMER TOE SURGERY    . TENNIS ELBOW RELEASE/NIRSCHEL PROCEDURE     Current Outpatient Medications on File Prior to Visit  Medication Sig  Dispense Refill  . acetaminophen (TYLENOL) 325 MG tablet Take 2 tablets (650 mg total) by mouth every 6 (six) hours as needed for mild pain, moderate pain or fever. 60 tablet 0  . Calcium Carbonate-Vit D-Min (CALCIUM 1200 PO) Take 1,200 mg by mouth.    . cholecalciferol (VITAMIN D) 1000 UNITS tablet Take 1,000 Units by mouth daily.    . ciprofloxacin (CIPRO) 500 MG tablet Take 1 tablet (500 mg total) by mouth 2 (two) times daily. 20 tablet 0  . denosumab (PROLIA) 60 MG/ML SOLN injection Inject 60 mg into the skin every 6 (six) months. Administer in upper arm, thigh, or abdomen    . diclofenac sodium (VOLTAREN) 1 % GEL Apply 2 g topically 4 (four) times daily. 100 g 1  . doxycycline (VIBRA-TABS) 100 MG tablet Take 1 tablet (100 mg total) by mouth 2 (two) times daily. 20 tablet 0  . DULoxetine (CYMBALTA) 60 MG capsule Take 1 capsule (60 mg total) by mouth daily. 30 capsule 12  . Melatonin 3 MG TABS Take 1 each by mouth at bedtime.    . metroNIDAZOLE (FLAGYL) 500 MG tablet Take 1 tablet (500 mg total) by mouth 2 (two) times daily. 20 tablet 0  . NONFORMULARY OR COMPOUNDED ITEM [facility]:  Antiinflammatory Cream - Diclofenac 3%, Baclofen 2%, Lidocaine 2%, apply 1-2 grams to affected area 3-4 times daily. 480 each 11  . Omega-3 Fatty Acids (FISH OIL)  1200 MG CAPS Take 1 capsule by mouth daily.    Marland Kitchen oxyCODONE-acetaminophen (PERCOCET) 5-325 MG tablet Take 1 tablet by mouth every 6 (six) hours as needed for severe pain. 30 tablet 0  . silver sulfADIAZINE (SILVADENE) 1 % cream Apply 1 application topically daily. 50 g 0  . simvastatin (ZOCOR) 10 MG tablet TAKE 1 TABLET BY MOUTH DAILY. 90 tablet 3  . TURMERIC PO Take 1,800 mg by mouth daily.     No current facility-administered medications on file prior to visit.    Allergies  Allergen Reactions  . Amitriptyline     Very tired, fatigued  . Asa [Aspirin] Other (See Comments)    Stomach irritation  . Gabapentin     Dizzy, very tired  .  Ibuprofen Other (See Comments)    Stomach irritation  . Latex    Social History   Socioeconomic History  . Marital status: Married    Spouse name: Not on file  . Number of children: Not on file  . Years of education: Not on file  . Highest education level: Not on file  Occupational History  . Not on file  Social Needs  . Financial resource strain: Not on file  . Food insecurity    Worry: Not on file    Inability: Not on file  . Transportation needs    Medical: Not on file    Non-medical: Not on file  Tobacco Use  . Smoking status: Former Smoker    Types: Cigarettes    Quit date: 12/14/1997    Years since quitting: 21.6  . Smokeless tobacco: Never Used  Substance and Sexual Activity  . Alcohol use: No  . Drug use: No  . Sexual activity: Not Currently  Lifestyle  . Physical activity    Days per week: Not on file    Minutes per session: Not on file  . Stress: Not on file  Relationships  . Social Herbalist on phone: Not on file    Gets together: Not on file    Attends religious service: Not on file    Active member of club or organization: Not on file    Attends meetings of clubs or organizations: Not on file    Relationship status: Not on file  . Intimate partner violence    Fear of current or ex partner: Not on file    Emotionally abused: Not on file    Physically abused: Not on file    Forced sexual activity: Not on file  Other Topics Concern  . Not on file  Social History Narrative   Lives with husband   Caffeine use: Coffee- 3 cups per day      Right handed    Family History  Problem Relation Age of Onset  . Depression Mother   . Cancer Mother        breast s/p mastectomy  . Breast cancer Mother   . Alzheimer's disease Father   . Early death Maternal Grandfather 46       [place] Accident     Review of Systems  All other systems reviewed and are negative.      Objective:   Physical Exam  Constitutional: She is oriented to person,  place, and time. She appears well-developed and well-nourished. No distress.  HENT:  Head: Normocephalic and atraumatic.  Right Ear: External ear normal.  Left Ear: External ear normal.  Nose: Nose normal.  Mouth/Throat: Oropharynx is clear and moist. No  oropharyngeal exudate.  Eyes: Pupils are equal, round, and reactive to light. Conjunctivae and EOM are normal. Right eye exhibits no discharge. Left eye exhibits no discharge. No scleral icterus.  Neck: Normal range of motion. Neck supple. No JVD present. No tracheal deviation present. No thyromegaly present.  Cardiovascular: Normal rate, regular rhythm, normal heart sounds and intact distal pulses. Exam reveals no gallop and no friction rub.  No murmur heard. Pulmonary/Chest: Effort normal and breath sounds normal. No stridor. No respiratory distress. She has no wheezes. She has no rales. She exhibits no tenderness.  Abdominal: Soft. Bowel sounds are normal. She exhibits no distension and no mass. There is no abdominal tenderness. There is no rebound and no guarding.  Musculoskeletal: Normal range of motion.        General: No tenderness or edema.  Lymphadenopathy:    She has no cervical adenopathy.  Neurological: She is alert and oriented to person, place, and time. She has normal reflexes. No cranial nerve deficit. She exhibits normal muscle tone. Coordination normal.  Skin: Skin is warm. No rash noted. She is not diaphoretic. No erythema. No pallor.  Psychiatric: She has a normal mood and affect. Her behavior is normal. Judgment and thought content normal.  Vitals reviewed.         Assessment & Plan:  Routine general medical examination at a health care facility  Pure hypercholesterolemia  Idiopathic peripheral neuropathy  Other osteoporosis without current pathological fracture - Plan: DG Bone Density   Physical exam is completely normal.  Patient will continue Prolia, vitamin D, and calcium for osteoporosis.  I will  schedule DEXA.  Colon cancer screening is up-to-date with a Cologuard.  Mammogram has been performed and is up-to-date.    Lab on 07/29/2019  Component Date Value Ref Range Status  . Cholesterol 07/29/2019 208* <200 mg/dL Final  . HDL 07/29/2019 64  > OR = 50 mg/dL Final  . Triglycerides 07/29/2019 193* <150 mg/dL Final  . LDL Cholesterol (Calc) 07/29/2019 113* mg/dL (calc) Final   Comment: Reference range: <100 . Desirable range <100 mg/dL for primary prevention;   <70 mg/dL for patients with CHD or diabetic patients  with > or = 2 CHD risk factors. Marland Kitchen LDL-C is now calculated using the Martin-Hopkins  calculation, which is a validated novel method providing  better accuracy than the Friedewald equation in the  estimation of LDL-C.  Cresenciano Genre et al. Annamaria Helling. [id number]): 2061-2068  (http://education.[facility].com/faq/FAQ164)   . Total CHOL/HDL Ratio 07/29/2019 3.3  <5.0 (calc) Final  . Non-HDL Cholesterol (Calc) 07/29/2019 144* <130 mg/dL (calc) Final   Comment: For patients with diabetes plus 1 major ASCVD risk  factor, treating to a non-HDL-C goal of <100 mg/dL  (LDL-C of <70 mg/dL) is considered a therapeutic  option.    Lab work is excellent except for a mild elevation in her total cholesterol and a mild elevation in her LDL cholesterol.  Patient has been more sedentary since the COVID-19 pandemic.  She would like to try exercising more.  She recently also had surgery to remove the hardware in her foot that is caused her chronic neuropathic pain.  Her doctor has yet to lease her to exercise.  She believes that once she is released to resume exercising she can get the cholesterol down on her own.  Patient received her flu shot today.

## 2019-08-06 ENCOUNTER — Ambulatory Visit (INDEPENDENT_AMBULATORY_CARE_PROVIDER_SITE_OTHER): Payer: PPO

## 2019-08-06 ENCOUNTER — Ambulatory Visit (INDEPENDENT_AMBULATORY_CARE_PROVIDER_SITE_OTHER): Payer: Self-pay | Admitting: Podiatry

## 2019-08-06 ENCOUNTER — Other Ambulatory Visit: Payer: Self-pay

## 2019-08-06 DIAGNOSIS — M2041 Other hammer toe(s) (acquired), right foot: Secondary | ICD-10-CM

## 2019-08-06 DIAGNOSIS — Z9889 Other specified postprocedural states: Secondary | ICD-10-CM

## 2019-08-10 NOTE — Progress Notes (Signed)
   Subjective:  Patient presents today status post right forefoot surgery. DOS: 06/12/2019. She reports an increase in pain of the plantar forefoot that began about six days ago when she did some yard work. Patient is here for further evaluation and treatment. She denies any known injury or trauma but believes being up on the foot increased the pain. She has been wearing good shoe gear as directed. Patient is here for further evaluation and treatment.     Past Medical History:  Diagnosis Date  . Hyperlipidemia   . Osteopenia       Objective/Physical Exam Neurovascular status intact.  Skin incisions appear to be well coapted. No sign of infectious process noted. No dehiscence. No active bleeding noted. Moderate edema noted to the surgical extremity.  Radiographic Exam:  Orthopedic hardware and osteotomies sites appear to be stable with routine healing.   Assessment: 1. s/p right forefoot surgery. DOS: 06/12/2019   Plan of Care:  1. Patient was evaluated. X-Rays reviewed.  2. Slowly increase activity.  3. Recommended good shoe gear with gel insoles.  4. Return to clinic in 2 months for final follow up X-Rays.    Edrick Kins, DPM [facility]  Dr. Edrick Kins, [place]                                        [place], [place] [address]                Office [phone]  Fax [phone]

## 2019-08-12 ENCOUNTER — Ambulatory Visit
Admission: RE | Admit: 2019-08-12 | Discharge: 2019-08-12 | Disposition: A | Discharge status: Home / Self Care | Payer: PPO | Source: Ambulatory Visit | Attending: Family Medicine | Admitting: Family Medicine

## 2019-08-12 ENCOUNTER — Other Ambulatory Visit: Payer: Self-pay

## 2019-08-12 DIAGNOSIS — M8589 Other specified disorders of bone density and structure, multiple sites: Secondary | ICD-10-CM | POA: Diagnosis not present

## 2019-08-12 DIAGNOSIS — Z780 Asymptomatic menopausal state: Secondary | ICD-10-CM | POA: Diagnosis not present

## 2019-08-12 DIAGNOSIS — M818 Other osteoporosis without current pathological fracture: Secondary | ICD-10-CM

## 2019-09-19 ENCOUNTER — Ambulatory Visit: Payer: PPO | Admitting: Podiatry

## 2019-09-22 ENCOUNTER — Other Ambulatory Visit: Payer: Self-pay

## 2019-09-22 ENCOUNTER — Ambulatory Visit (INDEPENDENT_AMBULATORY_CARE_PROVIDER_SITE_OTHER): Payer: PPO

## 2019-09-22 ENCOUNTER — Ambulatory Visit (INDEPENDENT_AMBULATORY_CARE_PROVIDER_SITE_OTHER): Payer: PPO | Admitting: Podiatry

## 2019-09-22 DIAGNOSIS — M2041 Other hammer toe(s) (acquired), right foot: Secondary | ICD-10-CM

## 2019-09-22 DIAGNOSIS — L600 Ingrowing nail: Secondary | ICD-10-CM

## 2019-09-22 NOTE — Patient Instructions (Signed)

## 2019-09-24 NOTE — Progress Notes (Signed)
   Subjective: 68 y.o. female presenting today right forefoot surgery. DOS: 06/12/2019. She states she is doing well. She reports some pain to the entire toenail of the right great toe that began a few weeks ago. She reports purulent drainage from the nail. Wearing shoes and applying pressure to the nail increases the pain. She has not done anything for treatment. Patient is here for further evaluation and treatment.   Past Medical History:  Diagnosis Date  . Hyperlipidemia   . Osteopenia     Objective:  General: Well developed, nourished, in no acute distress, alert and oriented x3   Dermatology: Skin is warm, dry and supple bilateral. Medial and lateral borders of the right great toe appears to be erythematous with evidence of an ingrowing nail. Pain on palpation noted to the border of the nail fold. The remaining nails appear unremarkable at this time. There are no open sores, lesions.  Vascular: Dorsalis Pedis artery and Posterior Tibial artery pedal pulses palpable. No lower extremity edema noted.   Neruologic: Grossly intact via light touch bilateral.  Musculoskeletal: Muscular strength within normal limits in all groups bilateral. Normal range of motion noted to all pedal and ankle joints.   Radiographic Exam:  Orthopedic hardware and osteotomies sites appear to be stable with routine healing.  Assessment:  #1 Paronychia with ingrowing nail medial and lateral border right hallux  #2 Pain in toe #3 Incurvated nail #4 s/p right forefoot surgery DOS: 06/12/2019  Plan of Care:  1. Patient evaluated. X-Rays reviewed.  2. Discussed treatment alternatives and plan of care. Explained nail avulsion procedure and post procedure course to patient. 3. Patient opted for total temporary nail avulsion of the right great toenail.  4. Prior to procedure, local anesthesia infiltration utilized using 3 ml of a 50:50 mixture of 2% plain lidocaine and 0.5% plain marcaine in a normal hallux block  fashion and a betadine prep performed.  5. Light dressing applied. 6. Recommended OTC antibiotic ointment.  7. Return to clinic in 2 weeks.   Edrick Kins, DPM [facility]  Dr. Edrick Kins, [place]                                        [place], [place] [address]                Office [phone]  Fax [phone]

## 2019-10-06 ENCOUNTER — Other Ambulatory Visit: Payer: Self-pay

## 2019-10-06 ENCOUNTER — Ambulatory Visit (INDEPENDENT_AMBULATORY_CARE_PROVIDER_SITE_OTHER): Payer: PPO | Admitting: Podiatry

## 2019-10-06 DIAGNOSIS — M2041 Other hammer toe(s) (acquired), right foot: Secondary | ICD-10-CM | POA: Diagnosis not present

## 2019-10-06 DIAGNOSIS — Z9889 Other specified postprocedural states: Secondary | ICD-10-CM

## 2019-10-06 DIAGNOSIS — L600 Ingrowing nail: Secondary | ICD-10-CM | POA: Diagnosis not present

## 2019-10-11 NOTE — Progress Notes (Signed)
   Subjective: Patient presents today status post total temporary nail avulsion procedure of the right great toe that was performed on 09/22/2019. She states she is doing well. She reports the nailbed is healing appropriately and she is no longer soaking the toe. She denies any complaints or concerns. Patient is here for further evaluation and treatment.   Past Medical History:  Diagnosis Date  . Hyperlipidemia   . Osteopenia     Objective: Skin is warm, dry and supple. Nail bed and respective nail fold appears to be healing appropriately. Open wound to the associated nail fold with a granular wound base and moderate amount of fibrotic tissue. Minimal drainage noted.   Assessment: #1 postop temporary total nail avulsion of the right great toe #2 open wound periungual nail fold and nail bed of respective digit.  #3 s/p right forefoot surgery DOS: 06/12/2019  Plan of care: #1 patient was evaluated  #2 debridement of open wound was performed to the periungual border and nail fold of the respective toe using a currette. Antibiotic ointment and Band-Aid was applied. #3 patient is to return to clinic on a PRN  basis.   Edrick Kins, DPM [facility]  Dr. Edrick Kins, [place]                                        [place], [place] [address]                Office [phone]  Fax [phone]

## 2019-10-14 MED FILL — SIMVASTATIN 10 MG TABLET: 10 | 90 days supply | Qty: 90 | Fill #1

## 2019-10-20 DIAGNOSIS — L821 Other seborrheic keratosis: Secondary | ICD-10-CM | POA: Diagnosis not present

## 2019-10-20 DIAGNOSIS — Z85828 Personal history of other malignant neoplasm of skin: Secondary | ICD-10-CM | POA: Diagnosis not present

## 2019-10-20 DIAGNOSIS — L814 Other melanin hyperpigmentation: Secondary | ICD-10-CM | POA: Diagnosis not present

## 2019-10-20 DIAGNOSIS — L570 Actinic keratosis: Secondary | ICD-10-CM | POA: Diagnosis not present

## 2019-11-03 DIAGNOSIS — I8311 Varicose veins of right lower extremity with inflammation: Secondary | ICD-10-CM | POA: Diagnosis not present

## 2019-11-03 DIAGNOSIS — I83811 Varicose veins of right lower extremities with pain: Secondary | ICD-10-CM | POA: Diagnosis not present

## 2019-11-03 DIAGNOSIS — I83891 Varicose veins of right lower extremities with other complications: Secondary | ICD-10-CM | POA: Diagnosis not present

## 2019-11-06 DIAGNOSIS — M1811 Unilateral primary osteoarthritis of first carpometacarpal joint, right hand: Secondary | ICD-10-CM | POA: Diagnosis not present

## 2019-11-06 DIAGNOSIS — G5601 Carpal tunnel syndrome, right upper limb: Secondary | ICD-10-CM | POA: Diagnosis not present

## 2019-11-06 DIAGNOSIS — M7989 Other specified soft tissue disorders: Secondary | ICD-10-CM | POA: Diagnosis not present

## 2019-11-13 DIAGNOSIS — I8311 Varicose veins of right lower extremity with inflammation: Secondary | ICD-10-CM | POA: Diagnosis not present

## 2019-11-13 DIAGNOSIS — I83811 Varicose veins of right lower extremities with pain: Secondary | ICD-10-CM | POA: Diagnosis not present

## 2019-11-26 DIAGNOSIS — M79641 Pain in right hand: Secondary | ICD-10-CM | POA: Diagnosis not present

## 2019-11-26 DIAGNOSIS — G5601 Carpal tunnel syndrome, right upper limb: Secondary | ICD-10-CM | POA: Diagnosis not present

## 2019-11-27 DIAGNOSIS — G5601 Carpal tunnel syndrome, right upper limb: Secondary | ICD-10-CM | POA: Diagnosis not present

## 2019-11-27 DIAGNOSIS — M1811 Unilateral primary osteoarthritis of first carpometacarpal joint, right hand: Secondary | ICD-10-CM | POA: Diagnosis not present

## 2019-11-27 DIAGNOSIS — M7989 Other specified soft tissue disorders: Secondary | ICD-10-CM | POA: Diagnosis not present

## 2019-12-15 ENCOUNTER — Ambulatory Visit: Payer: PPO | Attending: Internal Medicine

## 2019-12-15 DIAGNOSIS — Z23 Encounter for immunization: Secondary | ICD-10-CM

## 2019-12-15 NOTE — Progress Notes (Signed)
   Covid-19 Vaccination Clinic  Name:  [name]    MRN: [id number] DOB: Jul 07, 1951  12/15/2019  [name] was observed post Covid-19 immunization for 15 minutes without incidence. She was provided with Vaccine Information Sheet and instruction to access the V-Safe system.   [name] was instructed to call 911 with any severe reactions post vaccine: Marland Kitchen Difficulty breathing  . Swelling of your face and throat  . A fast heartbeat  . A bad rash all over your body  . Dizziness and weakness    Immunizations Administered    Name Date Dose VIS Date Route   Pfizer COVID-19 Vaccine 12/15/2019  8:39 AM 0.3 mL 09/26/2019 Intramuscular   Manufacturer: [place]   Lot: [id number]   [place]: [id number]

## 2020-01-08 ENCOUNTER — Other Ambulatory Visit: Payer: Self-pay

## 2020-01-08 ENCOUNTER — Ambulatory Visit (INDEPENDENT_AMBULATORY_CARE_PROVIDER_SITE_OTHER): Payer: PPO | Admitting: *Deleted

## 2020-01-08 DIAGNOSIS — M810 Age-related osteoporosis without current pathological fracture: Secondary | ICD-10-CM | POA: Diagnosis not present

## 2020-01-08 DIAGNOSIS — M818 Other osteoporosis without current pathological fracture: Secondary | ICD-10-CM

## 2020-01-08 MED ORDER — DENOSUMAB 60 MG/ML ~~LOC~~ SOSY
60.0000 mg | PREFILLED_SYRINGE | Freq: Once | SUBCUTANEOUS | Status: AC
  Administered 2020-01-08: 60 mg via SUBCUTANEOUS

## 2020-01-12 MED FILL — SIMVASTATIN 10 MG TABLET: 10 | 90 days supply | Qty: 90 | Fill #2

## 2020-01-13 ENCOUNTER — Ambulatory Visit: Payer: PPO | Attending: Internal Medicine

## 2020-01-13 DIAGNOSIS — Z23 Encounter for immunization: Secondary | ICD-10-CM

## 2020-01-13 NOTE — Progress Notes (Signed)
   Covid-19 Vaccination Clinic  Name:  [name]    MRN: [id number] DOB: 10/02/1951  01/13/2020  Ms. Chambers was observed post Covid-19 immunization for 15 minutes without incident. She was provided with Vaccine Information Sheet and instruction to access the V-Safe system.   [name] was instructed to call 911 with any severe reactions post vaccine: Marland Kitchen Difficulty breathing  . Swelling of face and throat  . A fast heartbeat  . A bad rash all over body  . Dizziness and weakness   Immunizations Administered    Name Date Dose VIS Date Route   Pfizer COVID-19 Vaccine 01/13/2020 10:15 AM 0.3 mL 09/26/2019 Intramuscular   Manufacturer: [employer]   Lot: [id number]   [place]: [id number]

## 2020-01-19 ENCOUNTER — Other Ambulatory Visit: Payer: Self-pay

## 2020-01-19 DIAGNOSIS — M654 Radial styloid tenosynovitis [de Quervain]: Secondary | ICD-10-CM | POA: Diagnosis not present

## 2020-01-19 DIAGNOSIS — G5601 Carpal tunnel syndrome, right upper limb: Secondary | ICD-10-CM | POA: Diagnosis not present

## 2020-01-19 DIAGNOSIS — M1811 Unilateral primary osteoarthritis of first carpometacarpal joint, right hand: Secondary | ICD-10-CM | POA: Diagnosis not present

## 2020-01-19 DIAGNOSIS — M67441 Ganglion, right hand: Secondary | ICD-10-CM | POA: Diagnosis not present

## 2020-01-19 MED FILL — OXYCODONE-ACETAMINOPHEN 5-3: 5-325 | 5 days supply | Qty: 20 | Fill #0

## 2020-01-22 DIAGNOSIS — M1811 Unilateral primary osteoarthritis of first carpometacarpal joint, right hand: Secondary | ICD-10-CM | POA: Diagnosis not present

## 2020-01-22 DIAGNOSIS — M7989 Other specified soft tissue disorders: Secondary | ICD-10-CM | POA: Diagnosis not present

## 2020-01-22 DIAGNOSIS — M25541 Pain in joints of right hand: Secondary | ICD-10-CM | POA: Diagnosis not present

## 2020-01-22 DIAGNOSIS — R600 Localized edema: Secondary | ICD-10-CM | POA: Diagnosis not present

## 2020-01-22 DIAGNOSIS — M25641 Stiffness of right hand, not elsewhere classified: Secondary | ICD-10-CM | POA: Diagnosis not present

## 2020-01-22 DIAGNOSIS — G5601 Carpal tunnel syndrome, right upper limb: Secondary | ICD-10-CM | POA: Diagnosis not present

## 2020-02-20 DIAGNOSIS — M1811 Unilateral primary osteoarthritis of first carpometacarpal joint, right hand: Secondary | ICD-10-CM | POA: Diagnosis not present

## 2020-02-27 ENCOUNTER — Encounter: Payer: Self-pay | Admitting: Family Medicine

## 2020-02-27 ENCOUNTER — Other Ambulatory Visit: Payer: Self-pay

## 2020-02-27 ENCOUNTER — Ambulatory Visit (INDEPENDENT_AMBULATORY_CARE_PROVIDER_SITE_OTHER): Payer: PPO | Admitting: Family Medicine

## 2020-02-27 VITALS — BP 136/90 | HR 80 | Temp 96.6°F | Resp 14 | Ht 63.0 in | Wt 152.0 lb

## 2020-02-27 DIAGNOSIS — J019 Acute sinusitis, unspecified: Secondary | ICD-10-CM

## 2020-02-27 DIAGNOSIS — B9689 Other specified bacterial agents as the cause of diseases classified elsewhere: Secondary | ICD-10-CM

## 2020-02-27 MED ORDER — AMOXICILLIN 875 MG PO TABS: 875.0000 mg | ORAL_TABLET | Freq: Two times a day (BID) | ORAL | 0 refills | Status: DC

## 2020-02-27 MED ORDER — FLUCONAZOLE 150 MG PO TABS: 150.0000 mg | ORAL_TABLET | Freq: Once | ORAL | 0 refills | Status: AC

## 2020-02-27 NOTE — Progress Notes (Signed)
Subjective:    Patient ID: [name], female    DOB: December 02, 1950, 69 y.o.   MRN: [id number]  HPI Patient is a very pleasant 69 year old Caucasian female who presents today with symptoms consistent with a sinus infection.  She states that her symptoms began 5 to 6 days ago.  Symptoms include pain and pressure behind her eyes.  She also complains of pain and pressure in her cheekbones.  She has been sneezing more congested recently.  She is also developed a frontal headache as well as tenderness and pain in her upper teeth.  She has had the symptoms before associated with a sinus infection.  She is also having a dull headache in her occiput however she does have a history of cervical degenerative disc disease.  She denies any cough or shortness of breath or sore throat. Past Medical History:  Diagnosis Date  . Hyperlipidemia   . Osteopenia    Past Surgical History:  Procedure Laterality Date  . ABDOMINAL HYSTERECTOMY     age 85, NO BSO  . BREAST BIOPSY Bilateral 1996   benign  . BREAST SURGERY     biopsy x 2, benign  . BUNIONECTOMY Left   . Carpel tunnel    . FOOT SURGERY     total joint replacement- left foot after bunionectomy  . FOOT SURGERY Right    correct bunionectomy done previously  . HAMMER TOE SURGERY    . TENNIS ELBOW RELEASE/NIRSCHEL PROCEDURE     Current Outpatient Medications on File Prior to Visit  Medication Sig Dispense Refill  . acetaminophen (TYLENOL) 325 MG tablet Take 2 tablets (650 mg total) by mouth every 6 (six) hours as needed for mild pain, moderate pain or fever. 60 tablet 0  . Calcium Carbonate-Vit D-Min (CALCIUM 1200 PO) Take 1,200 mg by mouth.    . cholecalciferol (VITAMIN D) 1000 UNITS tablet Take 1,000 Units by mouth daily.    Marland Kitchen denosumab (PROLIA) 60 MG/ML SOLN injection Inject 60 mg into the skin every 6 (six) months. Administer in upper arm, thigh, or abdomen    . diclofenac sodium (VOLTAREN) 1 % GEL Apply 2 g topically 4 (four) times daily.  100 g 1  . Melatonin 3 MG TABS Take 1 each by mouth at bedtime.    . NONFORMULARY OR COMPOUNDED ITEM [facility]:  Antiinflammatory Cream - Diclofenac 3%, Baclofen 2%, Lidocaine 2%, apply 1-2 grams to affected area 3-4 times daily. 480 each 11  . silver sulfADIAZINE (SILVADENE) 1 % cream Apply 1 application topically daily. 50 g 0  . simvastatin (ZOCOR) 10 MG tablet TAKE 1 TABLET BY MOUTH DAILY. 90 tablet 3  . TURMERIC PO Take 1,800 mg by mouth daily.     No current facility-administered medications on file prior to visit.   Allergies  Allergen Reactions  . Amitriptyline     Very tired, fatigued  . Asa [Aspirin] Other (See Comments)    Stomach irritation  . Gabapentin     Dizzy, very tired  . Ibuprofen Other (See Comments)    Stomach irritation  . Latex    Social History   Socioeconomic History  . Marital status: Married    Spouse name: Not on file  . Number of children: Not on file  . Years of education: Not on file  . Highest education level: Not on file  Occupational History  . Not on file  Tobacco Use  . Smoking status: Former Smoker    Types: Cigarettes  Quit date: 12/14/1997    Years since quitting: 22.2  . Smokeless tobacco: Never Used  Substance and Sexual Activity  . Alcohol use: No  . Drug use: No  . Sexual activity: Not Currently  Other Topics Concern  . Not on file  Social History Narrative   Lives with husband   Caffeine use: Coffee- 3 cups per day      Right handed    Social Determinants of Health   Financial Resource Strain:   . Difficulty of Paying Living Expenses:   Food Insecurity:   . Worried About Charity fundraiser in the Last Year:   . Arboriculturist in the Last Year:   Transportation Needs:   . Film/video editor (Medical):   Marland Kitchen Lack of Transportation (Non-Medical):   Physical Activity:   . Days of Exercise per Week:   . Minutes of Exercise per Session:   Stress:   . Feeling of Stress :   Social Connections:   .  Frequency of Communication with Friends and Family:   . Frequency of Social Gatherings with Friends and Family:   . Attends Religious Services:   . Active Member of Clubs or Organizations:   . Attends Archivist Meetings:   Marland Kitchen Marital Status:   Intimate Partner Violence:   . Fear of Current or Ex-Partner:   . Emotionally Abused:   Marland Kitchen Physically Abused:   . Sexually Abused:       Review of Systems  All other systems reviewed and are negative.      Objective:   Physical Exam Vitals reviewed.  Constitutional:      General: She is not in acute distress.    Appearance: Normal appearance. She is normal weight. She is not ill-appearing or toxic-appearing.  HENT:     Right Ear: Tympanic membrane and ear canal normal.     Left Ear: Tympanic membrane and ear canal normal.     Nose: Congestion present.     Right Sinus: Maxillary sinus tenderness and frontal sinus tenderness present.     Left Sinus: Maxillary sinus tenderness and frontal sinus tenderness present.     Mouth/Throat:     Pharynx: No oropharyngeal exudate.  Cardiovascular:     Rate and Rhythm: Normal rate and regular rhythm.     Heart sounds: Normal heart sounds.  Pulmonary:     Effort: Pulmonary effort is normal.     Breath sounds: Normal breath sounds.  Neurological:     Mental Status: She is alert.           Assessment & Plan:  Acute bacterial rhinosinusitis  Begin amoxicillin 875 mg p.o. twice daily for 10 days.  Use Flonase and/or Coricidin for head congestion.  Avoid Sudafed type medication is the patient has been complaining of palpitations recently.  Reassess in 1 week if no better or sooner if worse.

## 2020-03-19 DIAGNOSIS — M1811 Unilateral primary osteoarthritis of first carpometacarpal joint, right hand: Secondary | ICD-10-CM | POA: Diagnosis not present

## 2020-03-19 DIAGNOSIS — R29898 Other symptoms and signs involving the musculoskeletal system: Secondary | ICD-10-CM | POA: Diagnosis not present

## 2020-03-19 DIAGNOSIS — M25641 Stiffness of right hand, not elsewhere classified: Secondary | ICD-10-CM | POA: Diagnosis not present

## 2020-03-19 DIAGNOSIS — G5601 Carpal tunnel syndrome, right upper limb: Secondary | ICD-10-CM | POA: Diagnosis not present

## 2020-04-08 DIAGNOSIS — I83811 Varicose veins of right lower extremities with pain: Secondary | ICD-10-CM | POA: Diagnosis not present

## 2020-04-08 DIAGNOSIS — I8311 Varicose veins of right lower extremity with inflammation: Secondary | ICD-10-CM | POA: Diagnosis not present

## 2020-04-12 DIAGNOSIS — I8311 Varicose veins of right lower extremity with inflammation: Secondary | ICD-10-CM | POA: Diagnosis not present

## 2020-04-20 ENCOUNTER — Other Ambulatory Visit: Payer: Self-pay

## 2020-04-20 ENCOUNTER — Encounter: Payer: Self-pay | Admitting: Family Medicine

## 2020-04-20 DIAGNOSIS — L821 Other seborrheic keratosis: Secondary | ICD-10-CM | POA: Diagnosis not present

## 2020-04-20 DIAGNOSIS — L570 Actinic keratosis: Secondary | ICD-10-CM | POA: Diagnosis not present

## 2020-04-20 DIAGNOSIS — D0462 Carcinoma in situ of skin of left upper limb, including shoulder: Secondary | ICD-10-CM | POA: Diagnosis not present

## 2020-04-20 DIAGNOSIS — Z85828 Personal history of other malignant neoplasm of skin: Secondary | ICD-10-CM | POA: Diagnosis not present

## 2020-04-20 DIAGNOSIS — L814 Other melanin hyperpigmentation: Secondary | ICD-10-CM | POA: Diagnosis not present

## 2020-04-22 ENCOUNTER — Other Ambulatory Visit: Payer: Self-pay

## 2020-04-23 DIAGNOSIS — I8311 Varicose veins of right lower extremity with inflammation: Secondary | ICD-10-CM | POA: Diagnosis not present

## 2020-04-28 ENCOUNTER — Other Ambulatory Visit: Payer: Self-pay

## 2020-05-03 ENCOUNTER — Other Ambulatory Visit: Payer: Self-pay | Admitting: *Deleted

## 2020-05-11 ENCOUNTER — Other Ambulatory Visit: Payer: Self-pay

## 2020-05-12 DIAGNOSIS — I8311 Varicose veins of right lower extremity with inflammation: Secondary | ICD-10-CM | POA: Diagnosis not present

## 2020-05-12 DIAGNOSIS — M7981 Nontraumatic hematoma of soft tissue: Secondary | ICD-10-CM | POA: Diagnosis not present

## 2020-05-14 ENCOUNTER — Encounter: Payer: Self-pay | Admitting: Family Medicine

## 2020-05-14 DIAGNOSIS — Z1231 Encounter for screening mammogram for malignant neoplasm of breast: Secondary | ICD-10-CM | POA: Diagnosis not present

## 2020-06-18 ENCOUNTER — Telehealth: Payer: Self-pay | Admitting: *Deleted

## 2020-06-18 NOTE — Telephone Encounter (Signed)
Patient has requested to cancel appointment for Prolia injection. States that she will discuss treatment at her next OV with PCP.

## 2020-06-18 NOTE — Telephone Encounter (Signed)
ok 

## 2020-06-23 ENCOUNTER — Other Ambulatory Visit: Payer: Self-pay | Admitting: Family Medicine

## 2020-07-12 ENCOUNTER — Ambulatory Visit: Payer: PPO

## 2020-07-30 ENCOUNTER — Other Ambulatory Visit: Payer: Self-pay

## 2020-07-30 ENCOUNTER — Other Ambulatory Visit: Payer: PPO

## 2020-07-30 DIAGNOSIS — E559 Vitamin D deficiency, unspecified: Secondary | ICD-10-CM | POA: Diagnosis not present

## 2020-07-30 DIAGNOSIS — E7800 Pure hypercholesterolemia, unspecified: Secondary | ICD-10-CM

## 2020-07-30 DIAGNOSIS — Z13220 Encounter for screening for lipoid disorders: Secondary | ICD-10-CM | POA: Diagnosis not present

## 2020-07-30 DIAGNOSIS — Z136 Encounter for screening for cardiovascular disorders: Secondary | ICD-10-CM | POA: Diagnosis not present

## 2020-07-31 LAB — CBC WITH DIFFERENTIAL/PLATELET
Absolute Monocytes: 586 cells/uL (ref 200–950)
Basophils Absolute: 58 cells/uL (ref 0–200)
Basophils Relative: 1 %
Eosinophils Absolute: 81 cells/uL (ref 15–500)
Eosinophils Relative: 1.4 %
HCT: 40.6 % (ref 35.0–45.0)
Hemoglobin: 13.4 g/dL (ref 11.7–15.5)
Lymphs Abs: 2714 cells/uL (ref 850–3900)
MCH: 31.7 pg (ref 27.0–33.0)
MCHC: 33 g/dL (ref 32.0–36.0)
MCV: 96 fL (ref 80.0–100.0)
MPV: 11.2 fL (ref 7.5–12.5)
Monocytes Relative: 10.1 %
Neutro Abs: 2361 cells/uL (ref 1500–7800)
Neutrophils Relative %: 40.7 %
Platelets: 354 10*3/uL (ref 140–400)
RBC: 4.23 10*6/uL (ref 3.80–5.10)
RDW: 11.8 % (ref 11.0–15.0)
Total Lymphocyte: 46.8 %
WBC: 5.8 10*3/uL (ref 3.8–10.8)

## 2020-07-31 LAB — COMPLETE METABOLIC PANEL WITH GFR
AG Ratio: 1.6 (calc) (ref 1.0–2.5)
ALT: 13 U/L (ref 6–29)
AST: 20 U/L (ref 10–35)
Albumin: 4.6 g/dL (ref 3.6–5.1)
Alkaline phosphatase (APISO): 55 U/L (ref 37–153)
BUN: 8 mg/dL (ref 7–25)
CO2: 26 mmol/L (ref 20–32)
Calcium: 10.2 mg/dL (ref 8.6–10.4)
Chloride: 101 mmol/L (ref 98–110)
Creat: 0.63 mg/dL (ref 0.60–0.93)
GFR, Est African American: 105 mL/min/{1.73_m2} (ref >=60)
GFR, Est Non African American: 91 mL/min/{1.73_m2} (ref >=60)
Globulin: 2.8 g/dL (calc) (ref 1.9–3.7)
Glucose, Bld: 95 mg/dL (ref 65–99)
Potassium: 5 mmol/L (ref 3.5–5.3)
Sodium: 139 mmol/L (ref 135–146)
Total Bilirubin: 0.8 mg/dL (ref 0.2–1.2)
Total Protein: 7.4 g/dL (ref 6.1–8.1)

## 2020-07-31 LAB — LIPID PANEL
Cholesterol: 208 mg/dL — ABNORMAL HIGH (ref <200)
HDL: 71 mg/dL (ref >=50)
LDL Cholesterol (Calc): 109 mg/dL (calc) — ABNORMAL HIGH
Non-HDL Cholesterol (Calc): 137 mg/dL (calc) — ABNORMAL HIGH (ref <130)
Total CHOL/HDL Ratio: 2.9 (calc) (ref <5.0)
Triglycerides: 160 mg/dL — ABNORMAL HIGH (ref <150)

## 2020-07-31 LAB — VITAMIN D 25 HYDROXY (VIT D DEFICIENCY, FRACTURES): Vit D, 25-Hydroxy: 39 ng/mL (ref 30–100)

## 2020-08-03 ENCOUNTER — Other Ambulatory Visit: Payer: Self-pay

## 2020-08-03 ENCOUNTER — Ambulatory Visit (INDEPENDENT_AMBULATORY_CARE_PROVIDER_SITE_OTHER): Payer: PPO | Admitting: Family Medicine

## 2020-08-03 ENCOUNTER — Encounter: Payer: Self-pay | Admitting: Family Medicine

## 2020-08-03 VITALS — BP 120/64 | HR 84 | Temp 98.6°F | Ht 63.0 in | Wt 151.2 lb

## 2020-08-03 DIAGNOSIS — M818 Other osteoporosis without current pathological fracture: Secondary | ICD-10-CM

## 2020-08-03 DIAGNOSIS — Z0000 Encounter for general adult medical examination without abnormal findings: Secondary | ICD-10-CM | POA: Diagnosis not present

## 2020-08-03 DIAGNOSIS — Z23 Encounter for immunization: Secondary | ICD-10-CM | POA: Diagnosis not present

## 2020-08-03 DIAGNOSIS — E7800 Pure hypercholesterolemia, unspecified: Secondary | ICD-10-CM

## 2020-08-03 NOTE — Progress Notes (Signed)
Subjective:    Patient ID: [name], female    DOB: November 15, 1950, 69 y.o.   MRN: [id number]  HPI Patient is a very pleasant 69 year old white female who is here today for complete physical exam.  Patient was taking Prolia for osteoporosis.  She had a bone density test performed last year that showed improvement in her T score to -2.1 in the spine.  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  Her oral surgeon thought it could be related.  The tori have not improved.  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  I do not believe that the Prolia was likely contributing to either situation however I am comfortable with her stopping the Prolia due to the improvement in her T score as long as she continues calcium and vitamin D.  Tentatively we have plan to repeat her bone density test in 2023 to monitor for any progression.  Her mammogram was performed earlier this year and was normal.  She does not require a Pap smear.  She is overdue for a colonoscopy.  3 years ago she had a Cologuard which was negative.  She is due for some type of colon cancer screening now.  She denies any falls, depression, or memory loss.  Most recent lab work is listed below. Lab on 07/30/2020  Component Date Value Ref Range Status  . WBC 07/30/2020 5.8  3.8 - 10.8 Thousand/uL Final  . RBC 07/30/2020 4.23  3.80 - 5.10 Million/uL Final  . Hemoglobin 07/30/2020 13.4  11.7 - 15.5 g/dL Final  . HCT 07/30/2020 40.6  35 - 45 % Final  . MCV 07/30/2020 96.0  80.0 - 100.0 fL Final  . MCH 07/30/2020 31.7  27.0 - 33.0 pg Final  . MCHC 07/30/2020 33.0  32.0 - 36.0 g/dL Final  . RDW 07/30/2020 11.8  11.0 - 15.0 % Final  . Platelets 07/30/2020 354  140 - 400 Thousand/uL Final  . MPV 07/30/2020 11.2  7.5 - 12.5 fL Final  . Neutro Abs 07/30/2020 2,361  1,500 - 7,800 cells/uL Final  . Lymphs Abs 07/30/2020 2,714  850 - 3,900 cells/uL Final  . Absolute Monocytes 07/30/2020 586  200 - 950 cells/uL  Final  . Eosinophils Absolute 07/30/2020 81  15.0 - 500.0 cells/uL Final  . Basophils Absolute 07/30/2020 58  0.0 - 200.0 cells/uL Final  . Neutrophils Relative % 07/30/2020 40.7  % Final  . Total Lymphocyte 07/30/2020 46.8  % Final  . Monocytes Relative 07/30/2020 10.1  % Final  . Eosinophils Relative 07/30/2020 1.4  % Final  . Basophils Relative 07/30/2020 1.0  % Final  . Glucose, Bld 07/30/2020 95  65 - 99 mg/dL Final   Comment: .            Fasting reference interval .   . BUN 07/30/2020 8  7 - 25 mg/dL Final  . Creat 07/30/2020 0.63  0.60 - 0.93 mg/dL Final   Comment: For patients >69 years of age, the reference limit for Creatinine is approximately 13% higher for people identified as African-American. .   . GFR, Est Non African American 07/30/2020 91  > OR = 60 mL/min/1.65m2 Final  . GFR, Est African American 07/30/2020 105  > OR = 60 mL/min/1.74m2 Final  . BUN/Creatinine Ratio 78/29/5621 NOT APPLICABLE  6 - 22 (calc) Final  . Sodium 07/30/2020 139  135 - 146 mmol/L Final  . Potassium 07/30/2020 5.0  3.5 - 5.3 mmol/L Final  . Chloride 07/30/2020 101  98 - 110 mmol/L Final  . CO2 07/30/2020 26  20 - 32 mmol/L Final  . Calcium 07/30/2020 10.2  8.6 - 10.4 mg/dL Final  . Total Protein 07/30/2020 7.4  6.1 - 8.1 g/dL Final  . Albumin 07/30/2020 4.6  3.6 - 5.1 g/dL Final  . Globulin 07/30/2020 2.8  1.9 - 3.7 g/dL (calc) Final  . AG Ratio 07/30/2020 1.6  1.0 - 2.5 (calc) Final  . Total Bilirubin 07/30/2020 0.8  0.2 - 1.2 mg/dL Final  . Alkaline phosphatase (APISO) 07/30/2020 55  37 - 153 U/L Final  . AST 07/30/2020 20  10 - 35 U/L Final  . ALT 07/30/2020 13  6 - 29 U/L Final  . Vit D, 25-Hydroxy 07/30/2020 39  30 - 100 ng/mL Final   Comment: Vitamin D Status         25-OH Vitamin D: . Deficiency:                    <20 ng/mL Insufficiency:             20 - 29 ng/mL Optimal:                 > or = 30 ng/mL . For 25-OH Vitamin D testing on patients on  D2-supplementation and  patients for whom quantitation  of D2 and D3 fractions is required, the [facility](TM) 25-OH VIT D, (D2,D3), LC/MS/MS is recommended: order  code [id number] (patients >3yrs). See Note 1 . Note 1 . For additional information, please refer to  http://education.[facility].com/faq/FAQ199  (This link is being provided for informational/ educational purposes only.)   . Cholesterol 07/30/2020 208* <200 mg/dL Final  . HDL 07/30/2020 71  > OR = 50 mg/dL Final  . Triglycerides 07/30/2020 160* <150 mg/dL Final  . LDL Cholesterol (Calc) 07/30/2020 109* mg/dL (calc) Final   Comment: Reference range: <100 . Desirable range <100 mg/dL for primary prevention;   <70 mg/dL for patients with CHD or diabetic patients  with > or = 2 CHD risk factors. Marland Kitchen LDL-C is now calculated using the Martin-Hopkins  calculation, which is a validated novel method providing  better accuracy than the Friedewald equation in the  estimation of LDL-C.  Cresenciano Genre et al. Annamaria Helling. 7371;062(69): 2061-2068  (http://education.[facility].com/faq/FAQ164)   . Total CHOL/HDL Ratio 07/30/2020 2.9  <5.0 (calc) Final  . Non-HDL Cholesterol (Calc) 07/30/2020 137* <130 mg/dL (calc) Final   Comment: For patients with diabetes plus 1 major ASCVD risk  factor, treating to a non-HDL-C goal of <100 mg/dL  (LDL-C of <70 mg/dL) is considered a therapeutic  option.     Immunization History  Administered Date(s) Administered  . Fluad Quad(high Dose 65+) 07/31/2019  . Influenza, High Dose Seasonal PF 06/14/2016  . Influenza,inj,Quad PF,6+ Mos 07/15/2015, 06/19/2017, 07/04/2018  . Influenza-Unspecified 07/16/2014  . PFIZER SARS-COV-2 Vaccination 12/15/2019, 01/13/2020  . Pneumococcal Conjugate-13 05/29/2016  . Pneumococcal Polysaccharide-23 12/15/2014  . Tdap 03/16/2014  . Zoster Recombinat (Shingrix) 07/17/2018, 09/20/2018    Past Medical History:  Diagnosis Date  . Hyperlipidemia   . Osteopenia    Past Surgical  History:  Procedure Laterality Date  . ABDOMINAL HYSTERECTOMY     age 40, NO BSO  . BREAST BIOPSY Bilateral 1996   benign  . BREAST SURGERY     biopsy x 2, benign  . BUNIONECTOMY Left   . Carpel tunnel    . FOOT SURGERY  total joint replacement- left foot after bunionectomy  . FOOT SURGERY Right    correct bunionectomy done previously  . HAMMER TOE SURGERY    . TENNIS ELBOW RELEASE/NIRSCHEL PROCEDURE     Current Outpatient Medications on File Prior to Visit  Medication Sig Dispense Refill  . acetaminophen (TYLENOL) 325 MG tablet Take 2 tablets (650 mg total) by mouth every 6 (six) hours as needed for mild pain, moderate pain or fever. 60 tablet 0  . Calcium Carbonate-Vit D-Min (CALCIUM 1200 PO) Take 1,200 mg by mouth.    . cholecalciferol (VITAMIN D) 1000 UNITS tablet Take 1,000 Units by mouth daily.    . diclofenac sodium (VOLTAREN) 1 % GEL Apply 2 g topically 4 (four) times daily. 100 g 1  . Melatonin 3 MG TABS Take 1 each by mouth at bedtime.    . NONFORMULARY OR COMPOUNDED ITEM [facility]:  Antiinflammatory Cream - Diclofenac 3%, Baclofen 2%, Lidocaine 2%, apply 1-2 grams to affected area 3-4 times daily. 480 each 11  . simvastatin (ZOCOR) 10 MG tablet TAKE 1 TABLET BY MOUTH DAILY 90 tablet 3  . TURMERIC PO Take 1,800 mg by mouth daily.    Marland Kitchen amoxicillin (AMOXIL) 875 MG tablet Take 1 tablet (875 mg total) by mouth 2 (two) times daily. 20 tablet 0  . denosumab (PROLIA) 60 MG/ML SOLN injection Inject 60 mg into the skin every 6 (six) months. Administer in upper arm, thigh, or abdomen    . silver sulfADIAZINE (SILVADENE) 1 % cream Apply 1 application topically daily. 50 g 0   No current facility-administered medications on file prior to visit.   Allergies  Allergen Reactions  . Amitriptyline     Very tired, fatigued  . Asa [Aspirin] Other (See Comments)    Stomach irritation  . Gabapentin     Dizzy, very tired  . Ibuprofen Other (See Comments)    Stomach irritation   . Latex    Social History   Socioeconomic History  . Marital status: Married    Spouse name: Not on file  . Number of children: Not on file  . Years of education: Not on file  . Highest education level: Not on file  Occupational History  . Not on file  Tobacco Use  . Smoking status: Former Smoker    Types: Cigarettes    Quit date: 12/14/1997    Years since quitting: 22.6  . Smokeless tobacco: Never Used  Vaping Use  . Vaping Use: Never used  Substance and Sexual Activity  . Alcohol use: No  . Drug use: No  . Sexual activity: Not Currently  Other Topics Concern  . Not on file  Social History Narrative   Lives with husband   Caffeine use: Coffee- 3 cups per day      Right handed    Social Determinants of Health   Financial Resource Strain:   . Difficulty of Paying Living Expenses: Not on file  Food Insecurity:   . Worried About Charity fundraiser in the Last Year: Not on file  . Ran Out of Food in the Last Year: Not on file  Transportation Needs:   . Lack of Transportation (Medical): Not on file  . Lack of Transportation (Non-Medical): Not on file  Physical Activity:   . Days of Exercise per Week: Not on file  . Minutes of Exercise per Session: Not on file  Stress:   . Feeling of Stress : Not on file  Social Connections:   .  Frequency of Communication with Friends and Family: Not on file  . Frequency of Social Gatherings with Friends and Family: Not on file  . Attends Religious Services: Not on file  . Active Member of Clubs or Organizations: Not on file  . Attends Archivist Meetings: Not on file  . Marital Status: Not on file  Intimate Partner Violence:   . Fear of Current or Ex-Partner: Not on file  . Emotionally Abused: Not on file  . Physically Abused: Not on file  . Sexually Abused: Not on file   Family History  Problem Relation Age of Onset  . Depression Mother   . Cancer Mother        breast s/p mastectomy  . Breast cancer Mother   .  Alzheimer's disease Father   . Early death Maternal Grandfather 35       [place] Accident     Review of Systems  All other systems reviewed and are negative.      Objective:   Physical Exam Vitals reviewed.  Constitutional:      General: She is not in acute distress.    Appearance: She is well-developed. She is not diaphoretic.  HENT:     Head: Normocephalic and atraumatic.     Right Ear: External ear normal.     Left Ear: External ear normal.     Nose: Nose normal.     Mouth/Throat:     Pharynx: No oropharyngeal exudate.  Eyes:     General: No scleral icterus.       Right eye: No discharge.        Left eye: No discharge.     Conjunctiva/sclera: Conjunctivae normal.     Pupils: Pupils are equal, round, and reactive to light.  Neck:     Thyroid: No thyromegaly.     Vascular: No JVD.     Trachea: No tracheal deviation.  Cardiovascular:     Rate and Rhythm: Normal rate and regular rhythm.     Heart sounds: Normal heart sounds. No murmur heard.  No friction rub. No gallop.   Pulmonary:     Effort: Pulmonary effort is normal. No respiratory distress.     Breath sounds: Normal breath sounds. No stridor. No wheezing or rales.  Chest:     Chest wall: No tenderness.  Abdominal:     General: Bowel sounds are normal. There is no distension.     Palpations: Abdomen is soft. There is no mass.     Tenderness: There is no abdominal tenderness. There is no guarding or rebound.  Musculoskeletal:        General: No tenderness. Normal range of motion.     Cervical back: Normal range of motion and neck supple.  Lymphadenopathy:     Cervical: No cervical adenopathy.  Skin:    General: Skin is warm.     Coloration: Skin is not pale.     Findings: No erythema or rash.  Neurological:     Mental Status: She is alert and oriented to person, place, and time.     Cranial Nerves: No cranial nerve deficit.     Motor: No abnormal muscle tone.     Coordination: Coordination normal.     Deep  Tendon Reflexes: Reflexes are normal and symmetric.  Psychiatric:        Behavior: Behavior normal.        Thought Content: Thought content normal.        Judgment: Judgment normal.  Assessment & Plan:  Routine general medical examination at a health care facility  Other osteoporosis without current pathological fracture  Pure hypercholesterolemia  Based on her cholesterol, I calculated her 10-year risk of cardiovascular disease to be 8%.  With optimal risk factors, it only declines to 7%.  Therefore we will continue the patient on a low-dose simvastatin.  She received her flu shot today.  I did recommend a booster on the Emanuel Covid vaccine at her earliest convenience.  The remainder of her immunizations are up-to-date.  Recommended a colonoscopy or Cologuard whenever she decides.  She will consider and let me know what she wants me to order.  Mammogram is up-to-date.  Lab work is outstanding.  She denies any falls or depression or memory loss.  Regular anticipatory guidance is provided.

## 2020-08-03 NOTE — Patient Instructions (Signed)
° ° ° °  If you have lab work done today you will be contacted with your lab results within the next 2 weeks.  If you have not heard from us then please contact us. The fastest way to get your results is to register for My Chart. ° ° °IF you received an x-ray today, you will receive an invoice from [facility]. Please contact [facility] at [phone] with questions or concerns regarding your invoice.  ° °IF you received labwork today, you will receive an invoice from [facility]. Please contact [facility] at [phone] with questions or concerns regarding your invoice.  ° °Our billing staff will not be able to assist you with questions regarding bills from these companies. ° °You will be contacted with the lab results as soon as they are available. The fastest way to get your results is to activate your My Chart account. Instructions are located on the last page of this paperwork. If you have not heard from us regarding the results in 2 weeks, please contact this office. °  ° ° ° °

## 2020-08-17 DIAGNOSIS — I83811 Varicose veins of right lower extremities with pain: Secondary | ICD-10-CM | POA: Diagnosis not present

## 2020-08-17 DIAGNOSIS — M79604 Pain in right leg: Secondary | ICD-10-CM | POA: Diagnosis not present

## 2020-10-21 DIAGNOSIS — L821 Other seborrheic keratosis: Secondary | ICD-10-CM | POA: Diagnosis not present

## 2020-10-21 DIAGNOSIS — L570 Actinic keratosis: Secondary | ICD-10-CM | POA: Diagnosis not present

## 2020-10-21 DIAGNOSIS — Z85828 Personal history of other malignant neoplasm of skin: Secondary | ICD-10-CM | POA: Diagnosis not present

## 2020-10-21 DIAGNOSIS — D225 Melanocytic nevi of trunk: Secondary | ICD-10-CM | POA: Diagnosis not present

## 2020-10-21 DIAGNOSIS — L814 Other melanin hyperpigmentation: Secondary | ICD-10-CM | POA: Diagnosis not present

## 2021-01-10 ENCOUNTER — Ambulatory Visit (INDEPENDENT_AMBULATORY_CARE_PROVIDER_SITE_OTHER): Payer: PPO

## 2021-01-10 ENCOUNTER — Ambulatory Visit: Payer: PPO | Admitting: Orthopedic Surgery

## 2021-01-10 ENCOUNTER — Encounter: Payer: Self-pay | Admitting: Orthopedic Surgery

## 2021-01-10 ENCOUNTER — Ambulatory Visit: Payer: Self-pay

## 2021-01-10 VITALS — Ht 63.0 in | Wt 148.0 lb

## 2021-01-10 DIAGNOSIS — M5410 Radiculopathy, site unspecified: Secondary | ICD-10-CM

## 2021-01-10 DIAGNOSIS — M25571 Pain in right ankle and joints of right foot: Secondary | ICD-10-CM

## 2021-01-10 MED ORDER — PREDNISONE 10 MG PO TABS: 10.0000 mg | ORAL_TABLET | Freq: Every day | ORAL | 0 refills | Status: DC

## 2021-01-10 NOTE — Progress Notes (Signed)
Office Visit Note   Patient: [name]           Date of Birth: 11-08-1950           MRN: [id number] Visit Date: 01/10/2021              Requested by: Susy Frizzle, MD [address] [place],  [place] [address] PCP: Susy Frizzle, MD  Chief Complaint  Patient presents with  . Right Ankle - Pain      HPI: Patient is a 70 year old woman who presents complaining of lateral right ankle pain that she states has been present for 5 days she has also noticed some lower back pain on the left side.  She states the pain radiates on the lateral side of her ankle complains of a burning type pain.  Assessment & Plan: Visit Diagnoses:  1. Pain in right ankle and joints of right foot   2. Radicular pain of right lower extremity     Plan: We will start her on low-dose prednisone she will take 10 mg with breakfast and wean off as her symptoms improved.  Follow-Up Instructions: Return in about 3 weeks (around 01/31/2021).   Ortho Exam  Patient is alert, oriented, no adenopathy, well-dressed, normal affect, normal respiratory effort. Examination patient has no pain with range of motion or palpation around the right ankle compression of the syndesmosis is nontender.  She is tender over the distribution of the superficial peroneal nerve.  She has a negative straight leg raise no focal motor weakness.  Imaging: No results found. No images are attached to the encounter.  Labs: Lab Results  Component Value Date   ESRSEDRATE 17 07/29/2019   LABORGA  09/28/2016    Normal Upper Respiratory Flora No Beta Hemolytic Streptococci Isolated      Lab Results  Component Value Date   ALBUMIN 4.6 06/19/2017   ALBUMIN 4.8 05/29/2016   ALBUMIN 4.6 04/22/2015    No results found for: MG Lab Results  Component Value Date   VD25OH 39 07/30/2020   VD25OH 61 07/04/2018   VD25OH 49 04/16/2017    No results found for: PREALBUMIN CBC EXTENDED Latest Ref Rng & Units 07/30/2020  07/29/2019 07/04/2018  WBC 3.8 - 10.8 Thousand/uL 5.8 10.1 5.3  RBC 3.80 - 5.10 Million/uL 4.23 4.27 4.28  HGB 11.7 - 15.5 g/dL 13.4 13.2 13.3  HCT 35.0 - 45.0 % 40.6 40.3 39.0  PLT 140 - 400 Thousand/uL 354 366 318  NEUTROABS 1,500 - 7,800 cells/uL 2,361 6,525 2,793  LYMPHSABS 850 - 3,900 cells/uL 2,714 2,636 1,712     Body mass index is 26.22 kg/m.  Orders:  Orders Placed This Encounter  Procedures  . XR Ankle 2 Views Right  . XR Lumbar Spine 2-3 Views   No orders of the defined types were placed in this encounter.    Procedures: No procedures performed  Clinical Data: No additional findings.  ROS:  All other systems negative, except as noted in the HPI. Review of Systems  Objective: Vital Signs: Ht 5\' 3"  (1.6 m)   Wt 148 lb (67.1 kg)   BMI 26.22 kg/m   Specialty Comments:  No specialty comments available.  PMFS History: Patient Active Problem List   Diagnosis Date Noted  . Paresthesia 08/26/2018  . Breast pain, left 04/11/2017  . Osteoporosis 05/24/2015  . Osteopenia   . Hyperlipidemia    Past Medical History:  Diagnosis Date  . Hyperlipidemia   .  Osteopenia     Family History  Problem Relation Age of Onset  . Depression Mother   . Cancer Mother        breast s/p mastectomy  . Breast cancer Mother   . Alzheimer's disease Father   . Early death Maternal Grandfather 34       [place] Accident    Past Surgical History:  Procedure Laterality Date  . ABDOMINAL HYSTERECTOMY     age 13, NO BSO  . BREAST BIOPSY Bilateral 1996   benign  . BREAST SURGERY     biopsy x 2, benign  . BUNIONECTOMY Left   . Carpel tunnel    . FOOT SURGERY     total joint replacement- left foot after bunionectomy  . FOOT SURGERY Right    correct bunionectomy done previously  . HAMMER TOE SURGERY    . TENNIS ELBOW RELEASE/NIRSCHEL PROCEDURE     Social History   Occupational History  . Not on file  Tobacco Use  . Smoking status: Former Smoker    Types: Cigarettes     Quit date: 12/14/1997    Years since quitting: 23.0  . Smokeless tobacco: Never Used  Vaping Use  . Vaping Use: Never used  Substance and Sexual Activity  . Alcohol use: No  . Drug use: No  . Sexual activity: Not Currently

## 2021-01-31 ENCOUNTER — Encounter: Payer: Self-pay | Admitting: Orthopedic Surgery

## 2021-01-31 ENCOUNTER — Ambulatory Visit (INDEPENDENT_AMBULATORY_CARE_PROVIDER_SITE_OTHER): Payer: PPO | Admitting: Physician Assistant

## 2021-01-31 DIAGNOSIS — M25571 Pain in right ankle and joints of right foot: Secondary | ICD-10-CM

## 2021-01-31 NOTE — Progress Notes (Signed)
Office Visit Note   Patient: [name]           Date of Birth: 05-Mar-1951           MRN: [id number] Visit Date: 01/31/2021              Requested by: Susy Frizzle, MD [address] [place],  [place] [address] PCP: Susy Frizzle, MD  No chief complaint on file.     HPI: Patient presents today for follow-up on her right ankle pain and lower back pain.  She is 100% better.  She is inquiring about her lumbar spine x-ray which did demonstrate a 2 cm calcified aorta but no aneurysm.  Assessment & Plan: Visit Diagnoses: No diagnosis found.  Plan: She will followup with her primary care doctor and she already has an appointment with in August.  She will wean off the prednisone and see how she does.  Follow-Up Instructions: No follow-ups on file.   Ortho Exam  Patient is alert, oriented, no adenopathy, well-dressed, normal affect, normal respiratory effort. Examination of her right ankle no tenderness over the peroneal tendon good flexion and resisted eversion. Lower back no tenderness good plantar flexion dorsiflexion strength of her ankle negative straight leg raise  Imaging: No results found. No images are attached to the encounter.  Labs: Lab Results  Component Value Date   ESRSEDRATE 17 07/29/2019   LABORGA  09/28/2016    Normal Upper Respiratory Flora No Beta Hemolytic Streptococci Isolated      Lab Results  Component Value Date   ALBUMIN 4.6 06/19/2017   ALBUMIN 4.8 05/29/2016   ALBUMIN 4.6 04/22/2015    No results found for: MG Lab Results  Component Value Date   VD25OH 39 07/30/2020   VD25OH 61 07/04/2018   VD25OH 49 04/16/2017    No results found for: PREALBUMIN CBC EXTENDED Latest Ref Rng & Units 07/30/2020 07/29/2019 07/04/2018  WBC 3.8 - 10.8 Thousand/uL 5.8 10.1 5.3  RBC 3.80 - 5.10 Million/uL 4.23 4.27 4.28  HGB 11.7 - 15.5 g/dL 13.4 13.2 13.3  HCT 35.0 - 45.0 % 40.6 40.3 39.0  PLT 140 - 400 Thousand/uL 354 366 318   NEUTROABS 1,500 - 7,800 cells/uL 2,361 6,525 2,793  LYMPHSABS 850 - 3,900 cells/uL 2,714 2,636 1,712     There is no height or weight on file to calculate BMI.  Orders:  No orders of the defined types were placed in this encounter.  No orders of the defined types were placed in this encounter.    Procedures: No procedures performed  Clinical Data: No additional findings.  ROS:  All other systems negative, except as noted in the HPI. Review of Systems  Objective: Vital Signs: There were no vitals taken for this visit.  Specialty Comments:  No specialty comments available.  PMFS History: Patient Active Problem List   Diagnosis Date Noted  . Paresthesia 08/26/2018  . Breast pain, left 04/11/2017  . Osteoporosis 05/24/2015  . Osteopenia   . Hyperlipidemia    Past Medical History:  Diagnosis Date  . Hyperlipidemia   . Osteopenia     Family History  Problem Relation Age of Onset  . Depression Mother   . Cancer Mother        breast s/p mastectomy  . Breast cancer Mother   . Alzheimer's disease Father   . Early death Maternal Grandfather 43       [place] Accident    Past Surgical  History:  Procedure Laterality Date  . ABDOMINAL HYSTERECTOMY     age 50, NO BSO  . BREAST BIOPSY Bilateral 1996   benign  . BREAST SURGERY     biopsy x 2, benign  . BUNIONECTOMY Left   . Carpel tunnel    . FOOT SURGERY     total joint replacement- left foot after bunionectomy  . FOOT SURGERY Right    correct bunionectomy done previously  . HAMMER TOE SURGERY    . TENNIS ELBOW RELEASE/NIRSCHEL PROCEDURE     Social History   Occupational History  . Not on file  Tobacco Use  . Smoking status: Former Smoker    Types: Cigarettes    Quit date: 12/14/1997    Years since quitting: 23.1  . Smokeless tobacco: Never Used  Vaping Use  . Vaping Use: Never used  Substance and Sexual Activity  . Alcohol use: No  . Drug use: No  . Sexual activity: Not Currently

## 2021-02-06 ENCOUNTER — Other Ambulatory Visit: Payer: Self-pay | Admitting: Orthopedic Surgery

## 2021-04-13 ENCOUNTER — Telehealth: Payer: Self-pay | Admitting: *Deleted

## 2021-04-13 NOTE — Telephone Encounter (Signed)
Patient due for Cologuard re-screen.   Order placed via [employer].    Cologuard (Order [id number])

## 2021-05-06 DIAGNOSIS — Z1211 Encounter for screening for malignant neoplasm of colon: Secondary | ICD-10-CM | POA: Diagnosis not present

## 2021-05-06 DIAGNOSIS — Z1212 Encounter for screening for malignant neoplasm of rectum: Secondary | ICD-10-CM | POA: Diagnosis not present

## 2021-05-06 LAB — COLOGUARD: Cologuard: NEGATIVE

## 2021-05-18 ENCOUNTER — Encounter: Payer: Self-pay | Admitting: *Deleted

## 2021-05-18 NOTE — Telephone Encounter (Signed)
Received the results of Cologuard screening.   Screening noted negative.   A negative result indicates a low likelihood of colorectal cancer is present. Following a negative Cologuard result, the American Cancer Society recommends a Cologuard re-screening interval of 3 years.   Letter sent.   

## 2021-06-10 ENCOUNTER — Encounter: Payer: Self-pay | Admitting: Family Medicine

## 2021-06-10 DIAGNOSIS — Z1231 Encounter for screening mammogram for malignant neoplasm of breast: Secondary | ICD-10-CM | POA: Diagnosis not present

## 2021-07-18 ENCOUNTER — Other Ambulatory Visit: Payer: Self-pay | Admitting: Family Medicine

## 2021-07-21 ENCOUNTER — Telehealth: Payer: Self-pay | Admitting: Family Medicine

## 2021-07-21 ENCOUNTER — Other Ambulatory Visit: Payer: Self-pay

## 2021-07-21 ENCOUNTER — Ambulatory Visit (INDEPENDENT_AMBULATORY_CARE_PROVIDER_SITE_OTHER): Payer: PPO | Admitting: Family Medicine

## 2021-07-21 ENCOUNTER — Encounter: Payer: Self-pay | Admitting: Family Medicine

## 2021-07-21 VITALS — BP 122/64 | HR 84 | Temp 98.8°F | Resp 14 | Ht 63.0 in | Wt 148.0 lb

## 2021-07-21 DIAGNOSIS — Z23 Encounter for immunization: Secondary | ICD-10-CM | POA: Diagnosis not present

## 2021-07-21 DIAGNOSIS — E7800 Pure hypercholesterolemia, unspecified: Secondary | ICD-10-CM

## 2021-07-21 MED ORDER — SIMVASTATIN 10 MG PO TABS: 10.0000 mg | ORAL_TABLET | Freq: Every day | ORAL | 3 refills | Status: DC

## 2021-07-21 NOTE — Progress Notes (Signed)
Subjective:    Patient ID: [name], female    DOB: 08/28/1951, 70 y.o.   MRN: [id number]  HPI  Patient is a very pleasant 70 year old Caucasian female who is here today requesting a refill on her cholesterol medication.  She is due for a physical exam next month and has already made the appointment however our office would not refill the cholesterol medication without an appointment.  Needless to say the patient is frustrated by this and I can understand.  I apologize for this.  Overall she is doing well.  She denies any side effects on the medication.  She is not experiencing any myalgias or right upper quadrant pain.  She does have a history of SVT and she occasionally has that if she does not sleep well but she denies any angina related to physical activity.  She denies any shortness of breath or orthopnea.  She did receive her flu shot today  Past Medical History:  Diagnosis Date   Hyperlipidemia    Osteopenia    Past Surgical History:  Procedure Laterality Date   ABDOMINAL HYSTERECTOMY     age 58, NO BSO   BREAST BIOPSY Bilateral 1996   benign   BREAST SURGERY     biopsy x 2, benign   BUNIONECTOMY Left    Carpel tunnel     FOOT SURGERY     total joint replacement- left foot after bunionectomy   FOOT SURGERY Right    correct bunionectomy done previously   HAMMER TOE SURGERY     TENNIS ELBOW RELEASE/NIRSCHEL PROCEDURE     Current Outpatient Medications on File Prior to Visit  Medication Sig Dispense Refill   acetaminophen (TYLENOL) 325 MG tablet Take 2 tablets (650 mg total) by mouth every 6 (six) hours as needed for mild pain, moderate pain or fever. 60 tablet 0   Calcium Carbonate-Vit D-Min (CALCIUM 1200 PO) Take 1,200 mg by mouth.     cholecalciferol (VITAMIN D) 1000 UNITS tablet Take 1,000 Units by mouth daily.     diclofenac sodium (VOLTAREN) 1 % GEL Apply 2 g topically 4 (four) times daily. 100 g 1   Melatonin 3 MG TABS Take 1 each by mouth at bedtime.      simvastatin (ZOCOR) 10 MG tablet TAKE 1 TABLET BY MOUTH DAILY 90 tablet 3   TURMERIC PO Take 1,800 mg by mouth daily.     No current facility-administered medications on file prior to visit.   Allergies  Allergen Reactions   Amitriptyline     Very tired, fatigued   Asa [Aspirin] Other (See Comments)    Stomach irritation   Gabapentin     Dizzy, very tired   Ibuprofen Other (See Comments)    Stomach irritation   Latex    Social History   Socioeconomic History   Marital status: Married    Spouse name: Not on file   Number of children: Not on file   Years of education: Not on file   Highest education level: Not on file  Occupational History   Not on file  Tobacco Use   Smoking status: Former    Types: Cigarettes    Quit date: 12/14/1997    Years since quitting: 23.6   Smokeless tobacco: Never  Vaping Use   Vaping Use: Never used  Substance and Sexual Activity   Alcohol use: No   Drug use: No   Sexual activity: Not Currently  Other Topics Concern   Not on file  Social History Narrative   Lives with husband   Caffeine use: Coffee- 3 cups per day      Right handed    Social Determinants of Health   Financial Resource Strain: Not on file  Food Insecurity: Not on file  Transportation Needs: Not on file  Physical Activity: Not on file  Stress: Not on file  Social Connections: Not on file  Intimate Partner Violence: Not on file   Family History  Problem Relation Age of Onset   Depression Mother    Cancer Mother        breast s/p mastectomy   Breast cancer Mother    Alzheimer's disease Father    Early death Maternal Grandfather 68       [place] Accident     Review of Systems  All other systems reviewed and are negative.     Objective:   Physical Exam Vitals reviewed.  Constitutional:      General: She is not in acute distress.    Appearance: She is well-developed. She is not diaphoretic.  HENT:     Head: Normocephalic and atraumatic.     Right Ear:  External ear normal.     Left Ear: External ear normal.     Nose: Nose normal.     Mouth/Throat:     Pharynx: No oropharyngeal exudate.  Eyes:     General: No scleral icterus.       Right eye: No discharge.        Left eye: No discharge.     Conjunctiva/sclera: Conjunctivae normal.     Pupils: Pupils are equal, round, and reactive to light.  Neck:     Thyroid: No thyromegaly.     Vascular: No JVD.     Trachea: No tracheal deviation.  Cardiovascular:     Rate and Rhythm: Normal rate and regular rhythm.     Heart sounds: Normal heart sounds. No murmur heard.   No friction rub. No gallop.  Pulmonary:     Effort: Pulmonary effort is normal. No respiratory distress.     Breath sounds: Normal breath sounds. No stridor. No wheezing or rales.  Chest:     Chest wall: No tenderness.  Abdominal:     General: Bowel sounds are normal. There is no distension.     Palpations: Abdomen is soft. There is no mass.     Tenderness: There is no abdominal tenderness. There is no guarding or rebound.  Musculoskeletal:        General: No tenderness. Normal range of motion.     Cervical back: Normal range of motion and neck supple.  Lymphadenopathy:     Cervical: No cervical adenopathy.  Skin:    General: Skin is warm.     Coloration: Skin is not pale.     Findings: No erythema or rash.  Neurological:     Mental Status: She is alert and oriented to person, place, and time.     Cranial Nerves: No cranial nerve deficit.     Motor: No abnormal muscle tone.     Coordination: Coordination normal.     Deep Tendon Reflexes: Reflexes are normal and symmetric.  Psychiatric:        Behavior: Behavior normal.        Thought Content: Thought content normal.        Judgment: Judgment normal.          Assessment & Plan:  Pure hypercholesterolemia - Plan: COMPLETE METABOLIC PANEL WITH GFR, CBC  with Differential/Platelet, Lipid panel  Need for immunization against influenza - Plan: Flu Vaccine QUAD  High Dose(Fluad) I apologize to the patient about the misunderstanding.  I am sorry for any difficulty this caused her.  I will gladly refill her simvastatin.  While she is here fasting I will check a CBC CMP and lipid panel.  The patient received her flu shot today.

## 2021-07-21 NOTE — Telephone Encounter (Signed)
At checkout, patient confirmed refill for simvastatin (ZOCOR) 10 MG tablet [[id number]]  should be sent to   [id number] - [place], [place]  [address] [place] [address]  Phone:  [phone]  Fax:  [phone]  DEA #:  [id number]  Please advise at [phone]

## 2021-07-21 NOTE — Telephone Encounter (Signed)
Prescription sent to pharmacy.

## 2021-07-22 LAB — LIPID PANEL
Cholesterol: 199 mg/dL (ref <200)
HDL: 61 mg/dL (ref >=50)
LDL Cholesterol (Calc): 109 mg/dL (calc) — ABNORMAL HIGH
Non-HDL Cholesterol (Calc): 138 mg/dL (calc) — ABNORMAL HIGH (ref <130)
Total CHOL/HDL Ratio: 3.3 (calc) (ref <5.0)
Triglycerides: 171 mg/dL — ABNORMAL HIGH (ref <150)

## 2021-07-22 LAB — COMPLETE METABOLIC PANEL WITH GFR
AG Ratio: 1.8 (calc) (ref 1.0–2.5)
ALT: 13 U/L (ref 6–29)
AST: 21 U/L (ref 10–35)
Albumin: 4.6 g/dL (ref 3.6–5.1)
Alkaline phosphatase (APISO): 77 U/L (ref 37–153)
BUN: 13 mg/dL (ref 7–25)
CO2: 28 mmol/L (ref 20–32)
Calcium: 10.4 mg/dL (ref 8.6–10.4)
Chloride: 103 mmol/L (ref 98–110)
Creat: 0.67 mg/dL (ref 0.60–1.00)
Globulin: 2.5 g/dL (calc) (ref 1.9–3.7)
Glucose, Bld: 94 mg/dL (ref 65–99)
Potassium: 4.9 mmol/L (ref 3.5–5.3)
Sodium: 140 mmol/L (ref 135–146)
Total Bilirubin: 0.6 mg/dL (ref 0.2–1.2)
Total Protein: 7.1 g/dL (ref 6.1–8.1)
eGFR: 93 mL/min/{1.73_m2} (ref >=60)

## 2021-07-22 LAB — CBC WITH DIFFERENTIAL/PLATELET
Absolute Monocytes: 558 cells/uL (ref 200–950)
Basophils Absolute: 37 cells/uL (ref 0–200)
Basophils Relative: 0.6 %
Eosinophils Absolute: 50 cells/uL (ref 15–500)
Eosinophils Relative: 0.8 %
HCT: 40.3 % (ref 35.0–45.0)
Hemoglobin: 13.3 g/dL (ref 11.7–15.5)
Lymphs Abs: 2114 cells/uL (ref 850–3900)
MCH: 30.9 pg (ref 27.0–33.0)
MCHC: 33 g/dL (ref 32.0–36.0)
MCV: 93.7 fL (ref 80.0–100.0)
MPV: 11.2 fL (ref 7.5–12.5)
Monocytes Relative: 9 %
Neutro Abs: 3441 cells/uL (ref 1500–7800)
Neutrophils Relative %: 55.5 %
Platelets: 333 10*3/uL (ref 140–400)
RBC: 4.3 10*6/uL (ref 3.80–5.10)
RDW: 11.9 % (ref 11.0–15.0)
Total Lymphocyte: 34.1 %
WBC: 6.2 10*3/uL (ref 3.8–10.8)

## 2021-08-17 ENCOUNTER — Other Ambulatory Visit: Payer: Self-pay

## 2021-08-17 ENCOUNTER — Ambulatory Visit: Payer: PPO | Admitting: Podiatry

## 2021-08-17 ENCOUNTER — Encounter: Payer: Self-pay | Admitting: Podiatry

## 2021-08-17 DIAGNOSIS — B351 Tinea unguium: Secondary | ICD-10-CM

## 2021-08-17 DIAGNOSIS — M79674 Pain in right toe(s): Secondary | ICD-10-CM

## 2021-08-17 MED ORDER — GENTAMICIN SULFATE 0.1 % EX CREA: 1.0000 "application " | TOPICAL_CREAM | Freq: Two times a day (BID) | CUTANEOUS | 1 refills | Status: AC

## 2021-08-17 NOTE — Progress Notes (Signed)
   Subjective: 70 y.o. female presenting today for evaluation of pain and tenderness associated to the right hallux nail plate.  Patient had a total temporary nail avulsion performed a few years prior and she did very well with it however the nail slowly returned and now it is very sensitive and painful in close toed shoes.  Even light touch she states is severely painful.  She has not done anything for treatment currently.  She states that she is unable to get pedicures due to the pain.  She presents for further treatment evaluation  Past Medical History:  Diagnosis Date   Hyperlipidemia    Osteopenia     Objective:  General: Well developed, nourished, in no acute distress, alert and oriented x3   Dermatology: Skin is warm dry and supple bilateral.  Right great toe nail plate appears to be erythematous with evidence of a symptomatic hyperkeratotic dystrophic nail.  Vascular: Dorsalis Pedis artery and Posterior Tibial artery pedal pulses palpable. No lower extremity edema noted.   Neruologic: Grossly intact via light touch bilateral.  Musculoskeletal: History of right forefoot surgery associated tenderness to palpation right hallux nail plate  Assessment:  1.  Symptomatic dystrophic onychomycotic nail right hallux  Plan of Care:  1. Patient evaluated.  2. Discussed treatment alternatives and plan of care. Explained nail avulsion procedure and post procedure course to patient. 3. Patient opted for total permanent nail avulsion of the right hallux nail plate.  4. Prior to procedure, local anesthesia infiltration utilized using 3 ml of a 50:50 mixture of 2% plain lidocaine and 0.5% plain marcaine in a normal hallux block fashion and a betadine prep performed.  Nail was avulsed in's entirety followed by 3 x [id number] application of phenol and alcohol flush. 5. Light dressing applied.  Post care instructions provided both verbal and written 6. Return to clinic 3 weeks.   Edrick Kins,  DPM [facility]  Dr. Edrick Kins, DPM    [address] [place], [place] [address]                Office [phone]  Fax [phone]

## 2021-08-17 NOTE — Patient Instructions (Signed)

## 2021-08-18 ENCOUNTER — Other Ambulatory Visit: Payer: PPO

## 2021-08-19 ENCOUNTER — Encounter: Payer: Self-pay | Admitting: Family Medicine

## 2021-08-19 ENCOUNTER — Ambulatory Visit (INDEPENDENT_AMBULATORY_CARE_PROVIDER_SITE_OTHER): Payer: PPO | Admitting: Family Medicine

## 2021-08-19 ENCOUNTER — Other Ambulatory Visit: Payer: Self-pay

## 2021-08-19 VITALS — BP 120/68 | HR 78 | Temp 98.3°F | Resp 16 | Ht 63.0 in | Wt 149.0 lb

## 2021-08-19 DIAGNOSIS — M818 Other osteoporosis without current pathological fracture: Secondary | ICD-10-CM | POA: Diagnosis not present

## 2021-08-19 DIAGNOSIS — Z0001 Encounter for general adult medical examination with abnormal findings: Secondary | ICD-10-CM

## 2021-08-19 DIAGNOSIS — Z0000 Encounter for general adult medical examination without abnormal findings: Secondary | ICD-10-CM

## 2021-08-19 DIAGNOSIS — E7800 Pure hypercholesterolemia, unspecified: Secondary | ICD-10-CM | POA: Diagnosis not present

## 2021-08-19 NOTE — Progress Notes (Signed)
Subjective:    Patient ID: [name], female    DOB: 07-02-1951, 70 y.o.   MRN: [id number]  HPI Patient is a very pleasant 70 year old white female who is here today for complete physical exam.  Patient was taking Prolia for osteoporosis.  She had a bone density test performed in 2020 that showed improvement in her T score to -2.1 in the spine.  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  Her oral surgeon thought it could be related.  The tori have not improved.  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  Tentatively we have plan to repeat her bone density test in 2023 to monitor for any progression.  Her mammogram was performed earlier this year and was normal.  She does not require a Pap smear.  Patient had Cologuard testing earlier this year that was negative.  She is not due again until 2025.  Most recent lab work is listed below. Office Visit on 07/21/2021  Component Date Value Ref Range Status   Glucose, Bld 07/21/2021 94  65 - 99 mg/dL Final   Comment: .            Fasting reference interval .    BUN 07/21/2021 13  7 - 25 mg/dL Final   Creat 07/21/2021 0.67  0.60 - 1.00 mg/dL Final   eGFR 07/21/2021 93  > OR = 60 mL/min/1.14m Final   Comment: The eGFR is based on the CKD-EPI 2021 equation. To calculate  the new eGFR from a previous Creatinine or Cystatin C result, go to https://www.kidney.org/professionals/ kdoqi/gfr%5Fcalculator    BUN/Creatinine Ratio 161/60/7371NOT APPLICABLE  6 - 22 (calc) Final   Sodium 07/21/2021 140  135 - 146 mmol/L Final   Potassium 07/21/2021 4.9  3.5 - 5.3 mmol/L Final   Chloride 07/21/2021 103  98 - 110 mmol/L Final   CO2 07/21/2021 28  20 - 32 mmol/L Final   Calcium 07/21/2021 10.4  8.6 - 10.4 mg/dL Final   Total Protein 07/21/2021 7.1  6.1 - 8.1 g/dL Final   Albumin 07/21/2021 4.6  3.6 - 5.1 g/dL Final   Globulin 07/21/2021 2.5  1.9 - 3.7 g/dL (calc) Final   AG Ratio 07/21/2021 1.8  1.0 - 2.5  (calc) Final   Total Bilirubin 07/21/2021 0.6  0.2 - 1.2 mg/dL Final   Alkaline phosphatase (APISO) 07/21/2021 77  37 - 153 U/L Final   AST 07/21/2021 21  10 - 35 U/L Final   ALT 07/21/2021 13  6 - 29 U/L Final   WBC 07/21/2021 6.2  3.8 - 10.8 Thousand/uL Final   RBC 07/21/2021 4.30  3.80 - 5.10 Million/uL Final   Hemoglobin 07/21/2021 13.3  11.7 - 15.5 g/dL Final   HCT 07/21/2021 40.3  35.0 - 45.0 % Final   MCV 07/21/2021 93.7  80.0 - 100.0 fL Final   MCH 07/21/2021 30.9  27.0 - 33.0 pg Final   MCHC 07/21/2021 33.0  32.0 - 36.0 g/dL Final   RDW 07/21/2021 11.9  11.0 - 15.0 % Final   Platelets 07/21/2021 333  140 - 400 Thousand/uL Final   MPV 07/21/2021 11.2  7.5 - 12.5 fL Final   Neutro Abs 07/21/2021 3,441  1,500 - 7,800 cells/uL Final   Lymphs Abs 07/21/2021 2,114  850 - 3,900 cells/uL Final   Absolute Monocytes 07/21/2021 558  200 - 950 cells/uL Final   Eosinophils Absolute 07/21/2021 50  15 -  500 cells/uL Final   Basophils Absolute 07/21/2021 37  0 - 200 cells/uL Final   Neutrophils Relative % 07/21/2021 55.5  % Final   Total Lymphocyte 07/21/2021 34.1  % Final   Monocytes Relative 07/21/2021 9.0  % Final   Eosinophils Relative 07/21/2021 0.8  % Final   Basophils Relative 07/21/2021 0.6  % Final   Cholesterol 07/21/2021 199  <200 mg/dL Final   HDL 07/21/2021 61  > OR = 50 mg/dL Final   Triglycerides 07/21/2021 171 (A)  <150 mg/dL Final   LDL Cholesterol (Calc) 07/21/2021 109 (A)  mg/dL (calc) Final   Comment: Reference range: <100 . Desirable range <100 mg/dL for primary prevention;   <70 mg/dL for patients with CHD or diabetic patients  with > or = 2 CHD risk factors. Marland Kitchen LDL-C is now calculated using the Martin-Hopkins  calculation, which is a validated novel method providing  better accuracy than the Friedewald equation in the  estimation of LDL-C.  Cresenciano Genre et al. Annamaria Helling. 7342;876(81): 2061-2068  (http://education.[employer].com/faq/FAQ164)    Total CHOL/HDL  Ratio 07/21/2021 3.3  <5.0 (calc) Final   Non-HDL Cholesterol (Calc) 07/21/2021 138 (A)  <130 mg/dL (calc) Final   Comment: For patients with diabetes plus 1 major ASCVD risk  factor, treating to a non-HDL-C goal of <100 mg/dL  (LDL-C of <70 mg/dL) is considered a therapeutic  option.     Immunization History  Administered Date(s) Administered   Fluad Quad(high Dose 65+) 07/31/2019, 08/03/2020, 07/21/2021   Influenza, High Dose Seasonal PF 06/14/2016   Influenza,inj,Quad PF,6+ Mos 07/15/2015, 06/19/2017, 07/04/2018   Influenza-Unspecified 07/16/2014   PFIZER(Purple Top)SARS-COV-2 Vaccination 12/15/2019, 01/13/2020   Pneumococcal Conjugate-13 05/29/2016   Pneumococcal Polysaccharide-23 12/15/2014   Tdap 03/16/2014   Zoster Recombinat (Shingrix) 07/17/2018, 09/20/2018    Past Medical History:  Diagnosis Date   Hyperlipidemia    Osteopenia    Past Surgical History:  Procedure Laterality Date   ABDOMINAL HYSTERECTOMY     age 71, NO BSO   BREAST BIOPSY Bilateral 1996   benign   BREAST SURGERY     biopsy x 2, benign   BUNIONECTOMY Left    Carpel tunnel     FOOT SURGERY     total joint replacement- left foot after bunionectomy   FOOT SURGERY Right    correct bunionectomy done previously   HAMMER TOE SURGERY     TENNIS ELBOW RELEASE/NIRSCHEL PROCEDURE     Current Outpatient Medications on File Prior to Visit  Medication Sig Dispense Refill   acetaminophen (TYLENOL) 325 MG tablet Take 2 tablets (650 mg total) by mouth every 6 (six) hours as needed for mild pain, moderate pain or fever. 60 tablet 0   Calcium Carbonate-Vit D-Min (CALCIUM 1200 PO) Take 1,200 mg by mouth.     cholecalciferol (VITAMIN D) 1000 UNITS tablet Take 1,000 Units by mouth daily.     gentamicin cream (GARAMYCIN) 0.1 % Apply 1 application topically 2 (two) times daily. 30 g 1   Melatonin 3 MG TABS Take 1 each by mouth at bedtime.     simvastatin (ZOCOR) 10 MG tablet Take 1 tablet (10 mg total) by mouth  daily. 90 tablet 3   TURMERIC PO Take 1,800 mg by mouth daily.     No current facility-administered medications on file prior to visit.   Allergies  Allergen Reactions   Amitriptyline     Very tired, fatigued   Asa [Aspirin] Other (See Comments)    Stomach irritation   Gabapentin  Dizzy, very tired   Ibuprofen Other (See Comments)    Stomach irritation   Latex    Social History   Socioeconomic History   Marital status: Married    Spouse name: Not on file   Number of children: Not on file   Years of education: Not on file   Highest education level: Not on file  Occupational History   Not on file  Tobacco Use   Smoking status: Former    Types: Cigarettes    Quit date: 12/14/1997    Years since quitting: 23.6   Smokeless tobacco: Never  Vaping Use   Vaping Use: Never used  Substance and Sexual Activity   Alcohol use: No   Drug use: No   Sexual activity: Not Currently  Other Topics Concern   Not on file  Social History Narrative   Lives with husband   Caffeine use: Coffee- 3 cups per day      Right handed    Social Determinants of Health   Financial Resource Strain: Not on file  Food Insecurity: Not on file  Transportation Needs: Not on file  Physical Activity: Not on file  Stress: Not on file  Social Connections: Not on file  Intimate Partner Violence: Not on file   Family History  Problem Relation Age of Onset   Depression Mother    Cancer Mother        breast s/p mastectomy   Breast cancer Mother    Alzheimer's disease Father    Early death Maternal Grandfather 28       [place] Accident     Review of Systems  All other systems reviewed and are negative.     Objective:   Physical Exam Vitals reviewed.  Constitutional:      General: She is not in acute distress.    Appearance: She is well-developed. She is not diaphoretic.  HENT:     Head: Normocephalic and atraumatic.     Right Ear: External ear normal.     Left Ear: External ear normal.      Nose: Nose normal.     Mouth/Throat:     Pharynx: No oropharyngeal exudate.  Eyes:     General: No scleral icterus.       Right eye: No discharge.        Left eye: No discharge.     Conjunctiva/sclera: Conjunctivae normal.     Pupils: Pupils are equal, round, and reactive to light.  Neck:     Thyroid: No thyromegaly.     Vascular: No JVD.     Trachea: No tracheal deviation.  Cardiovascular:     Rate and Rhythm: Normal rate and regular rhythm.     Heart sounds: Normal heart sounds. No murmur heard.   No friction rub. No gallop.  Pulmonary:     Effort: Pulmonary effort is normal. No respiratory distress.     Breath sounds: Normal breath sounds. No stridor. No wheezing or rales.  Chest:     Chest wall: No tenderness.  Abdominal:     General: Bowel sounds are normal. There is no distension.     Palpations: Abdomen is soft. There is no mass.     Tenderness: There is no abdominal tenderness. There is no guarding or rebound.  Musculoskeletal:        General: No tenderness. Normal range of motion.     Cervical back: Normal range of motion and neck supple.  Lymphadenopathy:     Cervical: No  cervical adenopathy.  Skin:    General: Skin is warm.     Coloration: Skin is not pale.     Findings: No erythema or rash.  Neurological:     Mental Status: She is alert and oriented to person, place, and time.     Cranial Nerves: No cranial nerve deficit.     Motor: No abnormal muscle tone.     Coordination: Coordination normal.     Deep Tendon Reflexes: Reflexes are normal and symmetric.  Psychiatric:        Behavior: Behavior normal.        Thought Content: Thought content normal.        Judgment: Judgment normal.          Assessment & Plan:  Pure hypercholesterolemia  Routine general medical examination at a health care facility  Other osteoporosis without current pathological fracture Overall I am happy with her lab work.  I encouraged the patient to get the COVID booster.   The remainder of her immunizations are up-to-date.  Colon cancer screening and breast cancer screening are up-to-date.  She denies any falls or depression or memory loss.  Bone density test will be due next year.  Blood pressure today is excellent.

## 2021-08-26 ENCOUNTER — Telehealth: Payer: Self-pay | Admitting: *Deleted

## 2021-08-26 NOTE — Telephone Encounter (Signed)
Patient calling for instructions for aftercare of her toe after nail procedure. Returned the call to patient going over the aftercare instructions after nail removal, verbalized understanding and said that she is not having any pain or any other signs of infection.

## 2021-09-07 ENCOUNTER — Other Ambulatory Visit: Payer: Self-pay

## 2021-09-07 ENCOUNTER — Ambulatory Visit: Payer: PPO | Admitting: Podiatry

## 2021-09-07 DIAGNOSIS — M79674 Pain in right toe(s): Secondary | ICD-10-CM

## 2021-09-07 DIAGNOSIS — B351 Tinea unguium: Secondary | ICD-10-CM | POA: Diagnosis not present

## 2021-09-07 DIAGNOSIS — L603 Nail dystrophy: Secondary | ICD-10-CM | POA: Diagnosis not present

## 2021-09-07 NOTE — Progress Notes (Signed)
   Subjective: 70 y.o. female presenting today status post total permanent nail avulsion procedure of the right hallux nail plate that was performed on 08/17/2021.  Patient states she is doing well.  She has been soaking her foot and applying antibiotic cream as instructed.  No new complaints at this time.   Past Medical History:  Diagnosis Date   Hyperlipidemia    Osteopenia      Objective: Skin is warm, dry and supple. Nail bed and respective nail fold appears to be healing appropriately. Open wound to the associated nail fold with a granular wound base and moderate amount of fibrotic tissue. Minimal drainage noted. Mild erythema around the periungual region likely due to phenol chemical matricectomy.  Assessment: #1 postop permanent total nail avulsion right hallux nail plate  Plan of care: #1 patient was evaluated  #2 light debridement of open wound was performed to the periungual border of the respective toe using a currette and tissue nipper. Antibiotic ointment and Band-Aid was applied.  Continue daily foot soaks and antibiotic cream until completely resolved #3 patient is to return to clinic on a PRN  basis.   Edrick Kins, DPM [facility]  Dr. Edrick Kins, DPM    [address], [place] [address]                Office [phone]  Fax [phone]

## 2021-10-21 DIAGNOSIS — L570 Actinic keratosis: Secondary | ICD-10-CM | POA: Diagnosis not present

## 2021-10-21 DIAGNOSIS — L821 Other seborrheic keratosis: Secondary | ICD-10-CM | POA: Diagnosis not present

## 2021-10-21 DIAGNOSIS — Z85828 Personal history of other malignant neoplasm of skin: Secondary | ICD-10-CM | POA: Diagnosis not present

## 2021-10-21 DIAGNOSIS — L814 Other melanin hyperpigmentation: Secondary | ICD-10-CM | POA: Diagnosis not present

## 2021-10-21 DIAGNOSIS — D225 Melanocytic nevi of trunk: Secondary | ICD-10-CM | POA: Diagnosis not present

## 2022-03-06 ENCOUNTER — Ambulatory Visit (INDEPENDENT_AMBULATORY_CARE_PROVIDER_SITE_OTHER): Payer: PPO | Admitting: Family Medicine

## 2022-03-06 ENCOUNTER — Encounter: Payer: Self-pay | Admitting: Family Medicine

## 2022-03-06 VITALS — BP 110/70 | HR 75 | Temp 98.6°F | Ht 63.0 in | Wt 148.0 lb

## 2022-03-06 DIAGNOSIS — K219 Gastro-esophageal reflux disease without esophagitis: Secondary | ICD-10-CM | POA: Diagnosis not present

## 2022-03-06 MED ORDER — PANTOPRAZOLE SODIUM 40 MG PO TBEC: 40.0000 mg | DELAYED_RELEASE_TABLET | Freq: Every day | ORAL | 3 refills | Status: DC

## 2022-03-06 NOTE — Progress Notes (Signed)
Subjective:    Patient ID: [name], female    DOB: 05-17-1951, 71 y.o.   MRN: [id number]  Heartburn She complains of heartburn.   Patient reports intense pain in the epigastric area whenever she eats certain foods such as hamburger.  It also occurs when she drinks carbonated beverages such as cheerwine.  The pain can be intense almost on the level of the esophageal spasm.  She also reports dysphagia although she denies food sticking.  She denies any melena or hematochezia.  She denies any weight loss.  She denies any fever or chills.  Symptoms of been bad for the last 2 weeks but it been gradually building over time. Past Medical History:  Diagnosis Date   Hyperlipidemia    Osteopenia    Past Surgical History:  Procedure Laterality Date   ABDOMINAL HYSTERECTOMY     age 42, NO BSO   BREAST BIOPSY Bilateral 1996   benign   BREAST SURGERY     biopsy x 2, benign   BUNIONECTOMY Left    Carpel tunnel     FOOT SURGERY     total joint replacement- left foot after bunionectomy   FOOT SURGERY Right    correct bunionectomy done previously   HAMMER TOE SURGERY     TENNIS ELBOW RELEASE/NIRSCHEL PROCEDURE     Current Outpatient Medications on File Prior to Visit  Medication Sig Dispense Refill   acetaminophen (TYLENOL) 325 MG tablet Take 2 tablets (650 mg total) by mouth every 6 (six) hours as needed for mild pain, moderate pain or fever. 60 tablet 0   Calcium Carbonate-Vit D-Min (CALCIUM 1200 PO) Take 1,200 mg by mouth.     cholecalciferol (VITAMIN D) 1000 UNITS tablet Take 1,000 Units by mouth daily.     gentamicin cream (GARAMYCIN) 0.1 % Apply 1 application topically 2 (two) times daily. 30 g 1   Melatonin 3 MG TABS Take 1 each by mouth at bedtime.     simvastatin (ZOCOR) 10 MG tablet Take 1 tablet (10 mg total) by mouth daily. 90 tablet 3   TURMERIC PO Take 1,800 mg by mouth daily.     No current facility-administered medications on file prior to visit.   Allergies   Allergen Reactions   Amitriptyline     Very tired, fatigued   Asa [Aspirin] Other (See Comments)    Stomach irritation   Gabapentin     Dizzy, very tired   Ibuprofen Other (See Comments)    Stomach irritation   Latex    Social History   Socioeconomic History   Marital status: Married    Spouse name: Not on file   Number of children: Not on file   Years of education: Not on file   Highest education level: Not on file  Occupational History   Not on file  Tobacco Use   Smoking status: Former    Types: Cigarettes    Quit date: 12/14/1997    Years since quitting: 24.2   Smokeless tobacco: Never  Vaping Use   Vaping Use: Never used  Substance and Sexual Activity   Alcohol use: No   Drug use: No   Sexual activity: Not Currently  Other Topics Concern   Not on file  Social History Narrative   Lives with husband   Caffeine use: Coffee- 3 cups per day      Right handed    Social Determinants of Health   Financial Resource Strain: Not on file  Food Insecurity:  Not on file  Transportation Needs: Not on file  Physical Activity: Not on file  Stress: Not on file  Social Connections: Not on file  Intimate Partner Violence: Not on file   Family History  Problem Relation Age of Onset   Depression Mother    Cancer Mother        breast s/p mastectomy   Breast cancer Mother    Alzheimer's disease Father    Early death Maternal Grandfather 75       [place] Accident     Review of Systems  Gastrointestinal:  Positive for heartburn.  All other systems reviewed and are negative.     Objective:   Physical Exam Vitals reviewed.  Constitutional:      General: She is not in acute distress.    Appearance: She is well-developed. She is not diaphoretic.  HENT:     Head: Normocephalic and atraumatic.     Right Ear: External ear normal.     Left Ear: External ear normal.     Nose: Nose normal.     Mouth/Throat:     Pharynx: No oropharyngeal exudate.  Eyes:     General: No  scleral icterus.       Right eye: No discharge.        Left eye: No discharge.     Conjunctiva/sclera: Conjunctivae normal.     Pupils: Pupils are equal, round, and reactive to light.  Neck:     Thyroid: No thyromegaly.     Vascular: No JVD.     Trachea: No tracheal deviation.  Cardiovascular:     Rate and Rhythm: Normal rate and regular rhythm.     Heart sounds: Normal heart sounds. No murmur heard.   No friction rub. No gallop.  Pulmonary:     Effort: Pulmonary effort is normal. No respiratory distress.     Breath sounds: Normal breath sounds. No stridor. No wheezing or rales.  Chest:     Chest wall: No tenderness.  Abdominal:     General: Bowel sounds are normal. There is no distension.     Palpations: Abdomen is soft. There is no mass.     Tenderness: There is no abdominal tenderness. There is no guarding or rebound.  Musculoskeletal:        General: No tenderness. Normal range of motion.     Cervical back: Normal range of motion and neck supple.  Lymphadenopathy:     Cervical: No cervical adenopathy.  Skin:    General: Skin is warm.     Coloration: Skin is not pale.     Findings: No erythema or rash.  Neurological:     Mental Status: She is alert and oriented to person, place, and time.     Cranial Nerves: No cranial nerve deficit.     Motor: No abnormal muscle tone.     Coordination: Coordination normal.     Deep Tendon Reflexes: Reflexes are normal and symmetric.  Psychiatric:        Behavior: Behavior normal.        Thought Content: Thought content normal.        Judgment: Judgment normal.          Assessment & Plan:  Gastroesophageal reflux disease, unspecified whether esophagitis present - Plan: CBC with Differential/Platelet, COMPLETE METABOLIC PANEL WITH GFR, Lipase I suspect the patient is having esophageal spasms triggered by gastroesophageal reflux disease.  Begin Protonix 40 mg daily and recheck in 2 weeks.  If symptoms are  improving I would recommend  a total course of therapy for 6 weeks and then temporarily discontinued medication see if necessary.  Also check CBC CMP and lipase today however symptoms do not sound consistent with pancreatitis or biliary tract disease.

## 2022-03-07 LAB — CBC WITH DIFFERENTIAL/PLATELET
Absolute Monocytes: 580 cells/uL (ref 200–950)
Basophils Absolute: 41 cells/uL (ref 0–200)
Basophils Relative: 0.6 %
Eosinophils Absolute: 97 cells/uL (ref 15–500)
Eosinophils Relative: 1.4 %
HCT: 38.9 % (ref 35.0–45.0)
Hemoglobin: 13 g/dL (ref 11.7–15.5)
Lymphs Abs: 2243 cells/uL (ref 850–3900)
MCH: 31.6 pg (ref 27.0–33.0)
MCHC: 33.4 g/dL (ref 32.0–36.0)
MCV: 94.6 fL (ref 80.0–100.0)
MPV: 11.3 fL (ref 7.5–12.5)
Monocytes Relative: 8.4 %
Neutro Abs: 3940 cells/uL (ref 1500–7800)
Neutrophils Relative %: 57.1 %
Platelets: 367 10*3/uL (ref 140–400)
RBC: 4.11 10*6/uL (ref 3.80–5.10)
RDW: 11.8 % (ref 11.0–15.0)
Total Lymphocyte: 32.5 %
WBC: 6.9 10*3/uL (ref 3.8–10.8)

## 2022-03-07 LAB — LIPASE: Lipase: 33 U/L (ref 7–60)

## 2022-03-07 LAB — COMPLETE METABOLIC PANEL WITH GFR
AG Ratio: 1.7 (calc) (ref 1.0–2.5)
ALT: 11 U/L (ref 6–29)
AST: 17 U/L (ref 10–35)
Albumin: 4.5 g/dL (ref 3.6–5.1)
Alkaline phosphatase (APISO): 67 U/L (ref 37–153)
BUN: 10 mg/dL (ref 7–25)
CO2: 29 mmol/L (ref 20–32)
Calcium: 10 mg/dL (ref 8.6–10.4)
Chloride: 103 mmol/L (ref 98–110)
Creat: 0.7 mg/dL (ref 0.60–1.00)
Globulin: 2.7 g/dL (calc) (ref 1.9–3.7)
Glucose, Bld: 113 mg/dL — ABNORMAL HIGH (ref 65–99)
Potassium: 4.4 mmol/L (ref 3.5–5.3)
Sodium: 141 mmol/L (ref 135–146)
Total Bilirubin: 0.4 mg/dL (ref 0.2–1.2)
Total Protein: 7.2 g/dL (ref 6.1–8.1)
eGFR: 92 mL/min/{1.73_m2} (ref >=60)

## 2022-05-24 ENCOUNTER — Other Ambulatory Visit: Payer: Self-pay | Admitting: Family Medicine

## 2022-06-13 DIAGNOSIS — Z1231 Encounter for screening mammogram for malignant neoplasm of breast: Secondary | ICD-10-CM | POA: Diagnosis not present

## 2022-06-13 LAB — HM MAMMOGRAPHY

## 2022-06-26 ENCOUNTER — Ambulatory Visit (INDEPENDENT_AMBULATORY_CARE_PROVIDER_SITE_OTHER): Payer: PPO | Admitting: Family Medicine

## 2022-06-26 VITALS — BP 128/72 | HR 81 | Temp 98.7°F | Ht 63.0 in | Wt 151.0 lb

## 2022-06-26 DIAGNOSIS — I471 Supraventricular tachycardia: Secondary | ICD-10-CM | POA: Diagnosis not present

## 2022-06-26 MED ORDER — METOPROLOL SUCCINATE ER 25 MG PO TB24: 25.0000 mg | ORAL_TABLET | Freq: Every day | ORAL | 3 refills | Status: DC

## 2022-06-26 NOTE — Progress Notes (Signed)
Subjective:    Patient ID: [name], female    DOB: 1951/08/05, 71 y.o.   MRN: [id number]  Patient is a very sweet 71 year old Caucasian female with a history of SVT.  She was diagnosed with SVT in 1998 and sounds like she underwent cardioversion at the hospital due to unstable tachycardia.  Since that time she is done relatively well.  She has not even had to take medication.  However she states that over the last month she has had for 5 episodes where her heart will suddenly start to beat rapidly for no reason.  It lasts 1 to 2 minutes.  The most recent episode lasted up to 30 minutes.  She denies any chest pain or syncope or shortness of breath.  Her previous cardiologist was Dr. Tamala Julian. Past Medical History:  Diagnosis Date   Hyperlipidemia    Osteopenia    Past Surgical History:  Procedure Laterality Date   ABDOMINAL HYSTERECTOMY     age 65, NO BSO   BREAST BIOPSY Bilateral 1996   benign   BREAST SURGERY     biopsy x 2, benign   BUNIONECTOMY Left    Carpel tunnel     FOOT SURGERY     total joint replacement- left foot after bunionectomy   FOOT SURGERY Right    correct bunionectomy done previously   HAMMER TOE SURGERY     TENNIS ELBOW RELEASE/NIRSCHEL PROCEDURE     Current Outpatient Medications on File Prior to Visit  Medication Sig Dispense Refill   acetaminophen (TYLENOL) 325 MG tablet Take 2 tablets (650 mg total) by mouth every 6 (six) hours as needed for mild pain, moderate pain or fever. 60 tablet 0   Calcium Carbonate-Vit D-Min (CALCIUM 1200 PO) Take 1,200 mg by mouth.     cholecalciferol (VITAMIN D) 1000 UNITS tablet Take 1,000 Units by mouth daily.     gentamicin cream (GARAMYCIN) 0.1 % Apply 1 application topically 2 (two) times daily. 30 g 1   Melatonin 3 MG TABS Take 1 each by mouth at bedtime.     pantoprazole (PROTONIX) 40 MG tablet TAKE 1 TABLET BY MOUTH EVERY DAY 90 tablet 1   simvastatin (ZOCOR) 10 MG tablet Take 1 tablet (10 mg total) by mouth  daily. 90 tablet 3   TURMERIC PO Take 1,800 mg by mouth daily.     No current facility-administered medications on file prior to visit.   Allergies  Allergen Reactions   Amitriptyline     Very tired, fatigued   Asa [Aspirin] Other (See Comments)    Stomach irritation   Gabapentin     Dizzy, very tired   Ibuprofen Other (See Comments)    Stomach irritation   Latex    Social History   Socioeconomic History   Marital status: Married    Spouse name: Not on file   Number of children: Not on file   Years of education: Not on file   Highest education level: Not on file  Occupational History   Not on file  Tobacco Use   Smoking status: Former    Types: Cigarettes    Quit date: 12/14/1997    Years since quitting: 24.5   Smokeless tobacco: Never  Vaping Use   Vaping Use: Never used  Substance and Sexual Activity   Alcohol use: No   Drug use: No   Sexual activity: Not Currently  Other Topics Concern   Not on file  Social History Narrative   Lives  with husband   Caffeine use: Coffee- 3 cups per day      Right handed    Social Determinants of Health   Financial Resource Strain: Not on file  Food Insecurity: Not on file  Transportation Needs: Not on file  Physical Activity: Not on file  Stress: Not on file  Social Connections: Not on file  Intimate Partner Violence: Not on file   Family History  Problem Relation Age of Onset   Depression Mother    Cancer Mother        breast s/p mastectomy   Breast cancer Mother    Alzheimer's disease Father    Early death Maternal Grandfather 5       [place] Accident     Review of Systems  Gastrointestinal:  Positive for heartburn.  All other systems reviewed and are negative.      Objective:   Physical Exam Vitals reviewed.  Constitutional:      General: She is not in acute distress.    Appearance: She is well-developed. She is not diaphoretic.  HENT:     Head: Normocephalic and atraumatic.     Right Ear: External  ear normal.     Left Ear: External ear normal.     Nose: Nose normal.     Mouth/Throat:     Pharynx: No oropharyngeal exudate.  Eyes:     General: No scleral icterus.       Right eye: No discharge.        Left eye: No discharge.     Conjunctiva/sclera: Conjunctivae normal.     Pupils: Pupils are equal, round, and reactive to light.  Neck:     Thyroid: No thyromegaly.     Vascular: No JVD.     Trachea: No tracheal deviation.  Cardiovascular:     Rate and Rhythm: Normal rate and regular rhythm.     Heart sounds: Normal heart sounds. No murmur heard.    No friction rub. No gallop.  Pulmonary:     Effort: Pulmonary effort is normal. No respiratory distress.     Breath sounds: Normal breath sounds. No stridor. No wheezing or rales.  Chest:     Chest wall: No tenderness.  Abdominal:     General: Bowel sounds are normal. There is no distension.     Palpations: Abdomen is soft. There is no mass.     Tenderness: There is no abdominal tenderness. There is no guarding or rebound.  Musculoskeletal:        General: No tenderness. Normal range of motion.     Cervical back: Normal range of motion and neck supple.  Lymphadenopathy:     Cervical: No cervical adenopathy.  Skin:    General: Skin is warm.     Coloration: Skin is not pale.     Findings: No erythema or rash.  Neurological:     Mental Status: She is alert and oriented to person, place, and time.     Cranial Nerves: No cranial nerve deficit.     Motor: No abnormal muscle tone.     Coordination: Coordination normal.     Deep Tendon Reflexes: Reflexes are normal and symmetric.  Psychiatric:        Behavior: Behavior normal.        Thought Content: Thought content normal.        Judgment: Judgment normal.           Assessment & Plan:  Paroxysmal supraventricular tachycardia ([place]) - Plan: TSH,  BASIC METABOLIC PANEL WITH GFR Today she is in normal sinus rhythm. Start Toprol-XL 25 mg daily. Check electrolytes including a  TSH to look for reversible causes.  I will consult her cardiologist for an event monitor so that we can document whether this is truly SVT versus A-fib with RVR.

## 2022-06-27 LAB — BASIC METABOLIC PANEL WITH GFR
BUN: 11 mg/dL (ref 7–25)
CO2: 23 mmol/L (ref 20–32)
Calcium: 9.7 mg/dL (ref 8.6–10.4)
Chloride: 104 mmol/L (ref 98–110)
Creat: 0.74 mg/dL (ref 0.60–1.00)
Glucose, Bld: 93 mg/dL (ref 65–99)
Potassium: 4.5 mmol/L (ref 3.5–5.3)
Sodium: 140 mmol/L (ref 135–146)
eGFR: 86 mL/min/{1.73_m2} (ref >=60)

## 2022-06-27 LAB — TSH: TSH: 2.71 mIU/L (ref 0.40–4.50)

## 2022-07-06 ENCOUNTER — Encounter: Payer: Self-pay | Admitting: Family Medicine

## 2022-07-10 ENCOUNTER — Other Ambulatory Visit: Payer: Self-pay | Admitting: Family Medicine

## 2022-07-12 ENCOUNTER — Ambulatory Visit (INDEPENDENT_AMBULATORY_CARE_PROVIDER_SITE_OTHER): Payer: PPO | Admitting: Family Medicine

## 2022-07-12 ENCOUNTER — Encounter: Payer: Self-pay | Admitting: Family Medicine

## 2022-07-12 VITALS — BP 120/70 | HR 84 | Temp 98.0°F | Ht 63.0 in | Wt 150.0 lb

## 2022-07-12 DIAGNOSIS — R21 Rash and other nonspecific skin eruption: Secondary | ICD-10-CM

## 2022-07-12 DIAGNOSIS — L253 Unspecified contact dermatitis due to other chemical products: Secondary | ICD-10-CM

## 2022-07-12 DIAGNOSIS — M8589 Other specified disorders of bone density and structure, multiple sites: Secondary | ICD-10-CM | POA: Diagnosis not present

## 2022-07-12 DIAGNOSIS — Z780 Asymptomatic menopausal state: Secondary | ICD-10-CM | POA: Diagnosis not present

## 2022-07-12 LAB — HM DEXA SCAN

## 2022-07-12 MED ORDER — DESONIDE 0.05 % EX OINT: TOPICAL_OINTMENT | Freq: Two times a day (BID) | CUTANEOUS | Status: DC

## 2022-07-12 MED ORDER — DESONIDE 0.05 % EX CREA: TOPICAL_CREAM | Freq: Two times a day (BID) | CUTANEOUS | 0 refills | Status: AC

## 2022-07-12 NOTE — Addendum Note (Signed)
Addended by: Rubie Maid on: 07/12/2022 02:29 PM   Modules accepted: Orders

## 2022-07-12 NOTE — Progress Notes (Signed)
Acute Office Visit  Subjective:     Patient ID: [name], female    DOB: 09/01/1951, 71 y.o.   MRN: [id number]  Chief Complaint  Patient presents with   Follow-up    latex allergy - reaction on face, started Sunday due to face waxing done    Patient reports concerns of an allergic reaction. She has a history of latex allergy and had her eyebrows, lip and neck waxed Friday and she started breaking out Sunday. She reports redness, swelling, itching to her face. She has used calamine lotion for immediate relief and Benadryl BID since Monday. It seems to be improving but changing. She denies oral swelling, difficulty breathing, chest pain or palpitations.    Past Medical History:  Diagnosis Date   Hyperlipidemia    Osteopenia    Past Surgical History:  Procedure Laterality Date   ABDOMINAL HYSTERECTOMY     age 51, NO BSO   BREAST BIOPSY Bilateral 1996   benign   BREAST SURGERY     biopsy x 2, benign   BUNIONECTOMY Left    Carpel tunnel     FOOT SURGERY     total joint replacement- left foot after bunionectomy   FOOT SURGERY Right    correct bunionectomy done previously   HAMMER TOE SURGERY     TENNIS ELBOW RELEASE/NIRSCHEL PROCEDURE     Current Outpatient Medications on File Prior to Visit  Medication Sig Dispense Refill   acetaminophen (TYLENOL) 325 MG tablet Take 2 tablets (650 mg total) by mouth every 6 (six) hours as needed for mild pain, moderate pain or fever. 60 tablet 0   Calcium Carbonate-Vit D-Min (CALCIUM 1200 PO) Take 1,200 mg by mouth.     cholecalciferol (VITAMIN D) 1000 UNITS tablet Take 1,000 Units by mouth daily.     gentamicin cream (GARAMYCIN) 0.1 % Apply 1 application topically 2 (two) times daily. 30 g 1   Melatonin 3 MG TABS Take 1 each by mouth at bedtime.     metoprolol succinate (TOPROL-XL) 25 MG 24 hr tablet Take 1 tablet (25 mg total) by mouth daily. 30 tablet 3   pantoprazole (PROTONIX) 40 MG tablet TAKE 1 TABLET BY MOUTH EVERY DAY 90  tablet 1   simvastatin (ZOCOR) 10 MG tablet TAKE 1 TABLET BY MOUTH EVERY DAY 90 tablet 3   TURMERIC PO Take 1,800 mg by mouth daily.     No current facility-administered medications on file prior to visit.   Allergies  Allergen Reactions   Amitriptyline     Very tired, fatigued   Asa [Aspirin] Other (See Comments)    Stomach irritation   Gabapentin     Dizzy, very tired   Ibuprofen Other (See Comments)    Stomach irritation   Latex       Review of Systems  Skin:  Positive for itching and rash.        Objective:    BP 120/70   Pulse 84   Temp 98 F (36.7 C) (Oral)   Ht '5\' 3"'$  (1.6 m)   Wt 150 lb (68 kg)   SpO2 100%   BMI 26.57 kg/m   Physical Exam Constitutional:      Appearance: Normal appearance.  Skin:    Findings: Rash present. Rash is macular and papular.  Neurological:     Mental Status: She is alert.     No results found for any visits on 07/12/22.      Assessment & Plan:  Latex allergy, contact dermatitis - Plan: desonide (DESOWEN) 0.05 % ointment  Rash of face Diffuse contact dermatitis rash to face that is consistent with latex allergy reaction given her history and exposure. Encouraged to avoid allergens, and use cold compress or oatmeal baths. Will order low-potency Desonide ointment. Instructed to seek immediate medical attention for swelling of mouth, tongue, or throat, shortness of breath, chest pain.   Return if symptoms worsen or fail to improve.  Rubie Maid, FNP

## 2022-07-27 ENCOUNTER — Ambulatory Visit: Payer: PPO | Attending: Cardiovascular Disease

## 2022-07-27 ENCOUNTER — Ambulatory Visit: Payer: PPO | Attending: Cardiovascular Disease | Admitting: Cardiovascular Disease

## 2022-07-27 ENCOUNTER — Encounter: Payer: Self-pay | Admitting: Cardiovascular Disease

## 2022-07-27 VITALS — BP 128/74 | HR 72 | Ht 63.0 in | Wt 151.6 lb

## 2022-07-27 DIAGNOSIS — Z72820 Sleep deprivation: Secondary | ICD-10-CM

## 2022-07-27 DIAGNOSIS — I471 Supraventricular tachycardia, unspecified: Secondary | ICD-10-CM

## 2022-07-27 DIAGNOSIS — R002 Palpitations: Secondary | ICD-10-CM

## 2022-07-27 DIAGNOSIS — R0683 Snoring: Secondary | ICD-10-CM | POA: Diagnosis not present

## 2022-07-27 DIAGNOSIS — K219 Gastro-esophageal reflux disease without esophagitis: Secondary | ICD-10-CM | POA: Diagnosis not present

## 2022-07-27 MED ORDER — METOPROLOL TARTRATE 25 MG PO TABS: 25.0000 mg | ORAL_TABLET | Freq: Every day | ORAL | 3 refills | Status: DC | PRN

## 2022-07-27 NOTE — Progress Notes (Unsigned)
Enrolled for [employer] to mail a ZIO XT long term holter monitor to the patients address on file.  

## 2022-07-27 NOTE — Patient Instructions (Signed)
Medication Instructions:  START: Metoprolol tartrate 25 mg as needed for palpations.  *If you need a refill on your cardiac medications before your next appointment, please call your pharmacy*   Lab Work: None ordered today   Testing/Procedures: Your physician has requested that you have an echocardiogram. Echocardiography is a painless test that uses sound waves to create images of your heart. It provides your doctor with information about the size and shape of your heart and how well your heart's chambers and valves are working. This procedure takes approximately one hour. There are no restrictions for this procedure. [place] has recommended that you have a sleep study. This test records several body functions during sleep, including: brain activity, eye movement, oxygen and carbon dioxide blood levels, heart rate and rhythm, breathing rate and rhythm, the flow of air through your mouth and nose, snoring, body muscle movements, and chest and belly movement. [facility]  Your physician has recommended that you wear a 14 day Zio monitor.   This monitor is a medical device that records the heart's electrical activity. Doctors most often use these monitors to diagnose arrhythmias. Arrhythmias are problems with the speed or rhythm of the heartbeat. The monitor is a small device applied to your chest. You can wear one while you do your normal daily activities. While wearing this monitor if you have any symptoms to push the button and record what you felt. Once you have worn this monitor for the period of time provider prescribed (Usually 14 days), you will return the monitor device in the postage paid box. Once it is returned they will download the data collected and provide [place] with a report which the provider will then review and we will call you with those results. Important tips:  Avoid showering during the first 24 hours of wearing the  monitor. Avoid excessive sweating to help maximize wear time. Do not submerge the device, no hot tubs, and no swimming pools. Keep any lotions or oils away from the patch. After 24 hours you may shower with the patch on. Take brief showers with your back facing the shower head.  Do not remove patch once it has been placed because that will interrupt data and decrease adhesive wear time. Push the button when you have any symptoms and write down what you were feeling. Once you have completed wearing your monitor, remove and place into box which has postage paid and place in your outgoing mailbox.  If for some reason you have misplaced your box then call our office and we can provide another box and/or mail it off for you.    Follow-Up: At [facility], you and your health needs are our priority.  As part of our continuing mission to provide you with exceptional heart care, we have created designated Provider Care Teams.  These Care Teams include your primary Cardiologist (physician) and Advanced Practice Providers (APPs -  Physician Assistants and Nurse Practitioners) who all work together to provide you with the care you need, when you need it.  We recommend signing up for the patient portal called "MyChart".  Sign up information is provided on this After Visit Summary.  MyChart is used to connect with patients for Virtual Visits (Telemedicine).  Patients are able to view lab/test results, encounter notes, upcoming appointments, etc.  Non-urgent messages can be sent to your provider as well.   To learn more about what you can do with MyChart,  go to [email or web address].    Your next appointment:   6-8 week(s)  The format for your next appointment:   In Person  Provider:   Dr. Claiborne Billings

## 2022-07-27 NOTE — Progress Notes (Signed)
Cardiology Office Note    Date:  07/29/2022   ID:  [name], DOB 25-Jun-1951, MRN [id number]  PCP:  Susy Frizzle, MD  Cardiologist:  Shelva Majestic, MD   New patient evaluation referred by Dr. Dennard Schaumann for evaluation of palpitations/SVT   History of Present Illness:  [name] is a 71 y.o. female who is followed by Dr. Jenna Luo for primary care.  She has remote history of SVT diagnosed in 1998 at which time she had undergone cardioversion.  She had done relatively well over the subsequent years but since late August, she apparently had experienced 5 episodes where her heart rate would suddenly start to beat rapidly last for 1 to 2 minutes and then resolved.  More recently she had an episode that lasted 30 minutes and she was evaluated by Dr. Jenna Luo on June 26, 2022.  During his evaluation she was in normal sinus rhythm and Toprol-XL 25 mg was initiated.  Many years ago, she had seen Dr. Tamala Julian but had not seen him since.  She is now referred for cardiology evaluation and consideration of wearing a monitor for further evaluation.  Apparently, the patient filled a prescription for metoprolol succinate 25 mg but has not yet initiated therapy.  She was concerned that this may cause her blood pressure to drop which occurred remotely when taking medication.  She denies any chest pain.  She admits to very mild shortness of breath with activity.  She states her blood pressure at home typically is low at approximately 110-112/60.  She also admits that she does not sleep well and snores loudly and her sleep is nonrestorative.  Typically she goes to bed at 9 PM but typically reads for an hour and then typically gets up between 530 and 6 AM.  She has nocturnal GERD.  She also has issues with osteopenia and could not take Prolia.  She presents for cardiology evaluation.   Past Medical History:  Diagnosis Date   Hyperlipidemia    Osteopenia     Past Surgical  History:  Procedure Laterality Date   ABDOMINAL HYSTERECTOMY     age 66, NO BSO   BREAST BIOPSY Bilateral 1996   benign   BREAST SURGERY     biopsy x 2, benign   BUNIONECTOMY Left    Carpel tunnel     FOOT SURGERY     total joint replacement- left foot after bunionectomy   FOOT SURGERY Right    correct bunionectomy done previously   HAMMER TOE SURGERY     TENNIS ELBOW RELEASE/NIRSCHEL PROCEDURE      Current Medications: Outpatient Medications Prior to Visit  Medication Sig Dispense Refill   acetaminophen (TYLENOL) 325 MG tablet Take 2 tablets (650 mg total) by mouth every 6 (six) hours as needed for mild pain, moderate pain or fever. 60 tablet 0   Calcium Carbonate-Vit D-Min (CALCIUM 1200 PO) Take 1,200 mg by mouth.     cholecalciferol (VITAMIN D) 1000 UNITS tablet Take 1,000 Units by mouth daily.     desonide (DESOWEN) 0.05 % cream Apply topically 2 (two) times daily. 30 g 0   gentamicin cream (GARAMYCIN) 0.1 % Apply 1 application topically 2 (two) times daily. 30 g 1   Melatonin 3 MG TABS Take 1 each by mouth at bedtime.     pantoprazole (PROTONIX) 40 MG tablet TAKE 1 TABLET BY MOUTH EVERY DAY 90 tablet 1   simvastatin (ZOCOR) 10  MG tablet TAKE 1 TABLET BY MOUTH EVERY DAY 90 tablet 3   TURMERIC PO Take 1,800 mg by mouth daily.     metoprolol succinate (TOPROL-XL) 25 MG 24 hr tablet Take 1 tablet (25 mg total) by mouth daily. (Patient not taking: Reported on 07/27/2022) 30 tablet 3   No facility-administered medications prior to visit.     Allergies:   Amitriptyline, Asa [aspirin], Gabapentin, Ibuprofen, and Latex   Social History   Socioeconomic History   Marital status: Married    Spouse name: Not on file   Number of children: Not on file   Years of education: Not on file   Highest education level: Not on file  Occupational History   Not on file  Tobacco Use   Smoking status: Former    Types: Cigarettes    Quit date: 12/14/1997    Years since quitting: 24.6    Smokeless tobacco: Never  Vaping Use   Vaping Use: Never used  Substance and Sexual Activity   Alcohol use: No   Drug use: No   Sexual activity: Not Currently  Other Topics Concern   Not on file  Social History Narrative   Lives with husband   Caffeine use: Coffee- 3 cups per day      Right handed    Social Determinants of Health   Financial Resource Strain: Not on file  Food Insecurity: Not on file  Transportation Needs: Not on file  Physical Activity: Not on file  Stress: Not on file  Social Connections: Not on file    Socially, she was born in [place].  She is married for 54 years.  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  She remotely smoked cigarettes for 20 years but quit smoking in March 1999.  She walks and does light weights and tries to exercise at least 5 days/week for 20 minutes at a time.  She has 3 dogs which she walks.   Family History:  The patient's family history includes Alzheimer's disease in her father; Breast cancer in her mother; Cancer in her mother; Depression in her mother; Early death (age of onset: 49) in her maternal grandfather.  Her mother died at age's 42 and had diabetes and breast cancer.  Father died at age 80 and had Alzheimer's disease, prostate CA, and had suffered a heart attack.  She has 1 brother age 31.  A sister died at age 52 with 70 CVA and she has 2 living sisters ages 61 and 72.  She has 2 children ages 58 and 1.  ROS General: Negative; No fevers, chills, or night sweats;  HEENT: Negative; No changes in vision or hearing, sinus congestion, difficulty swallowing Pulmonary: Negative; No cough, wheezing, shortness of breath, hemoptysis Cardiovascular: See HPI GI: GERD GU: Negative; No dysuria, hematuria, or difficulty voiding Musculoskeletal: Negative; no myalgias, joint pain, or weakness Hematologic/Oncology: Negative; no easy bruising, bleeding Endocrine: Negative; no  heat/cold intolerance; no diabetes Neuro: Negative; no changes in balance, headaches Skin: Negative; No rashes or skin lesions Psychiatric: Negative; No behavioral problems, depression Sleep: Poor nonrestorative sleep, occasional daytime sleepiness, no bruxism, restless legs, hypnogognic hallucinations, no cataplexy An Epworth Sleepiness Scale score was calculated in the office today and this endorsed at 9 as shown below:  Epworth Sleepiness Scale: Situation   Chance of Dozing/Sleeping (0 = never , 1 = slight chance , 2 = moderate chance , 3 = high chance )   sitting  and reading 2   watching TV 2   sitting inactive in a public place 0   being a passenger in a motor vehicle for an hour or more 0   lying down in the afternoon 3   sitting and talking to someone 0   sitting quietly after lunch (no alcohol) 2   while stopped for a few minutes in traffic as the driver 0   Total Score  9     Other comprehensive 14 point system review is negative.   PHYSICAL EXAM:   VS:  BP 128/74 (BP Location: Left Arm, Patient Position: Sitting, Cuff Size: Normal)   Pulse 72   Ht '5\' 3"'$  (1.6 m)   Wt 151 lb 9.6 oz (68.8 kg)   SpO2 97%   BMI 26.85 kg/m     Repeat blood pressure by me was 134/74 supine and 130/74 standing.  Wt Readings from Last 3 Encounters:  07/27/22 151 lb 9.6 oz (68.8 kg)  07/12/22 150 lb (68 kg)  06/26/22 151 lb (68.5 kg)    General: Alert, oriented, no distress.  Skin: normal turgor, no rashes, warm and dry HEENT: Normocephalic, atraumatic. Pupils equal round and reactive to light; sclera anicteric; extraocular muscles intact;  Nose without nasal septal hypertrophy Mouth/Parynx benign; Mallinpatti scale 3 Neck: No JVD, no carotid bruits; normal carotid upstroke Lungs: clear to ausculatation and percussion; no wheezing or rales Chest wall: without tenderness to palpitation Heart: PMI not displaced, RRR, s1 s2 normal, 1/6 systolic murmur, no diastolic murmur, no rubs,  gallops, thrills, or heaves Abdomen: soft, nontender; no hepatosplenomehaly, BS+; abdominal aorta nontender and not dilated by palpation. Back: no CVA tenderness Pulses 2+ Musculoskeletal: full range of motion, normal strength, no joint deformities Extremities: no clubbing cyanosis or edema, Homan's sign negative  Neurologic: grossly nonfocal; Cranial nerves grossly wnl Psychologic: Normal mood and affect   Studies/Labs Reviewed:   July 27, 2022 ECG (independently read by me): NSR at 72, nonspecific  T wave abnormality; normal intervals  Recent Labs:    Latest Ref Rng & Units 06/26/2022    9:17 AM 03/06/2022    2:57 PM 07/21/2021   11:44 AM  BMP  Glucose 65 - 99 mg/dL 93  113  94   BUN 7 - 25 mg/dL '11  10  13   '$ Creatinine 0.60 - 1.00 mg/dL 0.74  0.70  0.67   BUN/Creat Ratio 6 - 22 (calc) SEE NOTE:  NOT APPLICABLE  NOT APPLICABLE   Sodium 540 - 146 mmol/L 140  141  140   Potassium 3.5 - 5.3 mmol/L 4.5  4.4  4.9   Chloride 98 - 110 mmol/L 104  103  103   CO2 20 - 32 mmol/L '23  29  28   '$ Calcium 8.6 - 10.4 mg/dL 9.7  10.0  10.4         Latest Ref Rng & Units 03/06/2022    2:57 PM 07/21/2021   11:44 AM 07/30/2020    8:25 AM  Hepatic Function  Total Protein 6.1 - 8.1 g/dL 7.2  7.1  7.4   AST 10 - 35 U/L '17  21  20   '$ ALT 6 - 29 U/L '11  13  13   '$ Total Bilirubin 0.2 - 1.2 mg/dL 0.4  0.6  0.8        Latest Ref Rng & Units 03/06/2022    2:57 PM 07/21/2021   11:44 AM 07/30/2020    8:25 AM  CBC  WBC 3.8 - 10.8 Thousand/uL 6.9  6.2  5.8   Hemoglobin 11.7 - 15.5 g/dL 13.0  13.3  13.4   Hematocrit 35.0 - 45.0 % 38.9  40.3  40.6   Platelets 140 - 400 Thousand/uL 367  333  354    Lab Results  Component Value Date   MCV 94.6 03/06/2022   MCV 93.7 07/21/2021   MCV 96.0 07/30/2020   Lab Results  Component Value Date   TSH 2.71 06/26/2022   No results found for: "HGBA1C"   BNP No results found for: "BNP"  ProBNP No results found for: "PROBNP"   Lipid Panel      Component Value Date/Time   CHOL 199 07/21/2021 1144   TRIG 171 (H) 07/21/2021 1144   HDL 61 07/21/2021 1144   CHOLHDL 3.3 07/21/2021 1144   VLDL 40 (H) 06/19/2017 1007   LDLCALC 109 (H) 07/21/2021 1144     RADIOLOGY: No results found.   Additional studies/ records that were reviewed today include:   I have reviewed the records of Dr. Jenna Luo   ASSESSMENT:    1. Palpitation   2. Snoring   3. History of SVT (supraventricular tachycardia)   4. Poor sleep   5. Gastroesophageal reflux disease without esophagitis     PLAN:  Ms. Siona Coulston is a very pleasant 71 year old female who previously worked in Pharmacist, community at [employer].  She had developed an episode of SVT over 20 years ago and may have undergone cardioversion at that time and remotely had seen Dr. Tamala Julian.  She had been doing exceptionally well until the last several months where she has experienced episodes of short-lived rapid onset and offset tachycardia highly suggestive of bursts of SVT.  Apparently her longest episode lasted approximately 30 minutes and self resolved.  Remotely, she had been given a medication for her episode and apparently this had resulted in very low  blood pressure leading to her discontinuance.  She was recently seen by Dr. Jenna Luo and was given a prescription for metoprolol succinate 25 mg daily.  However, due to her concerns of potentially precipitating low blood pressure she never implemented therapy.  Presently, her ECG today shows normal sinus rhythm at 72 bpm.  I am recommending she undergo a 2D echo Doppler study for assessment of LV systolic, diastolic function and valvular architecture.  I am scheduling her to wear a Zio patch monitor for the next 2 weeks.  I am giving her prescription for metoprolol to tartrate 25 mg in the event she had an episode of tacky dysrhythmia where she could take 12.5 to 25 mg on a as needed basis.  I am concerned she may have obstructive sleep  apnea with symptoms of poor sleep, snoring, nocturnal GERD, and at times daytime sleepiness.  I have recommended she undergo a sleep sleep study for further assessment.  She had undergone recent laboratory with Dr. Dennard Schaumann.  Electrolytes were stable with creatinine 0.74.  TSH was 1.79.  I will contact her regarding the results of the above study and plan to see her in 6 to 8 weeks for follow-up evaluation.   Medication Adjustments/Labs and Tests Ordered: Current medicines are reviewed at length with the patient today.  Concerns regarding medicines are outlined above.  Medication changes, Labs and Tests ordered today are listed in the Patient Instructions below. Patient Instructions  Medication Instructions:  START: Metoprolol tartrate 25 mg as needed for palpations.  *If you need a refill on your cardiac  medications before your next appointment, please call your pharmacy*   Lab Work: None ordered today   Testing/Procedures: Your physician has requested that you have an echocardiogram. Echocardiography is a painless test that uses sound waves to create images of your heart. It provides your doctor with information about the size and shape of your heart and how well your heart's chambers and valves are working. This procedure takes approximately one hour. There are no restrictions for this procedure. [facility] has recommended that you have a sleep study. This test records several body functions during sleep, including: brain activity, eye movement, oxygen and carbon dioxide blood levels, heart rate and rhythm, breathing rate and rhythm, the flow of air through your mouth and nose, snoring, body muscle movements, and chest and belly movement. [facility]  Your physician has recommended that you wear a 14 day Zio monitor.   This monitor is a medical device that records the heart's electrical activity. Doctors most often use these monitors to  diagnose arrhythmias. Arrhythmias are problems with the speed or rhythm of the heartbeat. The monitor is a small device applied to your chest. You can wear one while you do your normal daily activities. While wearing this monitor if you have any symptoms to push the button and record what you felt. Once you have worn this monitor for the period of time provider prescribed (Usually 14 days), you will return the monitor device in the postage paid box. Once it is returned they will download the data collected and provide [place] with a report which the provider will then review and we will call you with those results. Important tips:  Avoid showering during the first 24 hours of wearing the monitor. Avoid excessive sweating to help maximize wear time. Do not submerge the device, no hot tubs, and no swimming pools. Keep any lotions or oils away from the patch. After 24 hours you may shower with the patch on. Take brief showers with your back facing the shower head.  Do not remove patch once it has been placed because that will interrupt data and decrease adhesive wear time. Push the button when you have any symptoms and write down what you were feeling. Once you have completed wearing your monitor, remove and place into box which has postage paid and place in your outgoing mailbox.  If for some reason you have misplaced your box then call our office and we can provide another box and/or mail it off for you.    Follow-Up: At [facility], you and your health needs are our priority.  As part of our continuing mission to provide you with exceptional heart care, we have created designated Provider Care Teams.  These Care Teams include your primary Cardiologist (physician) and Advanced Practice Providers (APPs -  Physician Assistants and Nurse Practitioners) who all work together to provide you with the care you need, when you need it.  We recommend signing up for the patient portal called "MyChart".   Sign up information is provided on this After Visit Summary.  MyChart is used to connect with patients for Virtual Visits (Telemedicine).  Patients are able to view lab/test results, encounter notes, upcoming appointments, etc.  Non-urgent messages can be sent to your provider as well.   To learn more about what you can do with MyChart, go to [email or web address].    Your next appointment:   6-8 week(s)  The format for your next appointment:  In Person  Provider:   Dr. Claiborne Billings            Signed, Shelva Majestic, MD  07/29/2022 11:26 AM    [facility] [address] [place], [place], [place]  [address] Phone: [phone]

## 2022-07-29 ENCOUNTER — Encounter: Payer: Self-pay | Admitting: Cardiovascular Disease

## 2022-07-31 DIAGNOSIS — R002 Palpitations: Secondary | ICD-10-CM

## 2022-08-09 ENCOUNTER — Ambulatory Visit (HOSPITAL_COMMUNITY): Payer: PPO | Attending: Cardiovascular Disease

## 2022-08-09 DIAGNOSIS — E785 Hyperlipidemia, unspecified: Secondary | ICD-10-CM | POA: Diagnosis not present

## 2022-08-09 DIAGNOSIS — I071 Rheumatic tricuspid insufficiency: Secondary | ICD-10-CM | POA: Diagnosis not present

## 2022-08-09 DIAGNOSIS — R002 Palpitations: Secondary | ICD-10-CM | POA: Diagnosis not present

## 2022-08-09 DIAGNOSIS — I361 Nonrheumatic tricuspid (valve) insufficiency: Secondary | ICD-10-CM | POA: Diagnosis not present

## 2022-08-09 LAB — ECHOCARDIOGRAM COMPLETE
Area-P 1/2: 3.65 cm2
S' Lateral: 2.1 cm

## 2022-08-11 DIAGNOSIS — H35373 Puckering of macula, bilateral: Secondary | ICD-10-CM | POA: Diagnosis not present

## 2022-08-18 ENCOUNTER — Other Ambulatory Visit: Payer: PPO

## 2022-08-21 ENCOUNTER — Encounter: Payer: PPO | Admitting: Family Medicine

## 2022-08-23 ENCOUNTER — Ambulatory Visit (HOSPITAL_BASED_OUTPATIENT_CLINIC_OR_DEPARTMENT_OTHER): Payer: PPO | Attending: Cardiovascular Disease | Admitting: Cardiovascular Disease

## 2022-08-23 DIAGNOSIS — R002 Palpitations: Secondary | ICD-10-CM | POA: Diagnosis not present

## 2022-08-23 DIAGNOSIS — R0683 Snoring: Secondary | ICD-10-CM

## 2022-08-30 ENCOUNTER — Encounter: Payer: Self-pay | Admitting: Cardiovascular Disease

## 2022-08-30 ENCOUNTER — Ambulatory Visit: Payer: PPO | Attending: Cardiovascular Disease | Admitting: Cardiovascular Disease

## 2022-08-30 DIAGNOSIS — R0683 Snoring: Secondary | ICD-10-CM

## 2022-08-30 DIAGNOSIS — I471 Supraventricular tachycardia, unspecified: Secondary | ICD-10-CM

## 2022-08-30 DIAGNOSIS — E782 Mixed hyperlipidemia: Secondary | ICD-10-CM | POA: Diagnosis not present

## 2022-08-30 DIAGNOSIS — K219 Gastro-esophageal reflux disease without esophagitis: Secondary | ICD-10-CM

## 2022-08-30 DIAGNOSIS — R002 Palpitations: Secondary | ICD-10-CM

## 2022-08-30 DIAGNOSIS — Z72820 Sleep deprivation: Secondary | ICD-10-CM

## 2022-08-30 NOTE — Patient Instructions (Signed)
Medication Instructions:  START the Metoprolol Succinate- take 0.5 tablet for the first 4 days, then increase to a whole tablet.   *If you need a refill on your cardiac medications before your next appointment, please call your pharmacy*   Testing/Procedures: Your physician has recommended that you have a sleep study. This test records several body functions during sleep, including: brain activity, eye movement, oxygen and carbon dioxide blood levels, heart rate and rhythm, breathing rate and rhythm, the flow of air through your mouth and nose, snoring, body muscle movements, and chest and belly movement.   Follow-Up: At [facility], you and your health needs are our priority.  As part of our continuing mission to provide you with exceptional heart care, we have created designated Provider Care Teams.  These Care Teams include your primary Cardiologist (physician) and Advanced Practice Providers (APPs -  Physician Assistants and Nurse Practitioners) who all work together to provide you with the care you need, when you need it.  We recommend signing up for the patient portal called "MyChart".  Sign up information is provided on this After Visit Summary.  MyChart is used to connect with patients for Virtual Visits (Telemedicine).  Patients are able to view lab/test results, encounter notes, upcoming appointments, etc.  Non-urgent messages can be sent to your provider as well.   To learn more about what you can do with MyChart, go to [email or web address].    Your next appointment:   3 month(s)  The format for your next appointment:   In Person  Provider:   Shelva Majestic, MD

## 2022-08-30 NOTE — Progress Notes (Signed)
Cardiology Office Note    Date:  09/04/2022   ID:  Desiree, Ray [birth year], MRN [id number]  PCP:  Susy Frizzle, MD  Cardiologist:  Shelva Majestic, MD   One month F/U  evaluation initially referred by Dr. Dennard Schaumann for evaluation of palpitations/SVT  History of Present Illness:  [name] is a 71 y.o. female who is followed by Dr. Jenna Luo for primary care.  She has remote history of SVT diagnosed in 1998 at which time she had undergone cardioversion.  She had done relatively well over the subsequent years but since late August, she apparently had experienced 5 episodes where her heart rate would suddenly start to beat rapidly last for 1 to 2 minutes and then resolved.  More recently she had an episode that lasted 30 minutes and she was evaluated by Dr. Jenna Luo on June 26, 2022.  During his evaluation she was in normal sinus rhythm and Toprol-XL 25 mg was initiated.  Many years ago, she had seen Dr. Tamala Julian but had not seen him since.  She is now referred for cardiology evaluation and consideration of wearing a monitor for further evaluation.  When I initially saw her on July 27, 2022 she had filled the prescription for metoprolol succinate 25 mg but has not yet initiated therapy.  She was concerned that this may cause her blood pressure to drop which occurred remotely when taking medication.  She denies any chest pain.  She admits to very mild shortness of breath with activity.  She states her blood pressure at home typically is low at approximately 110-112/60.  She also admits that she does not sleep well and snores loudly and her sleep is nonrestorative.  Typically she goes to bed at 9 PM but typically reads for an hour and then typically gets up between 530 and 6 AM.  She has nocturnal GERD.  She also has issues with osteopenia and could not take Prolia.  During her evaluation, I recommended that she have a Zio patch monitor for 2 weeks and gave her  prescription for metoprolol tartrate 25 mg to have available where she could take 12.5 to 25 mg on an as-needed basis.  I was also concerned that she may very well have obstructive sleep apnea I recommended she undergo a sleep study.  Since I saw her, she wore a Zio patch monitor from October 16 through August 14, 2022.  Predominant rhythm was sinus with an average heart rate at 76 bpm.  Slowest sinus rhythm was 53 bpm while sleeping at 4:38 AM and the maximum was sinus tachycardia at 5:53 PM at 122 bpm.  She had frequent short bursts of SVT with the fastest interval lasting 12 beats with a max rate of 231 and the longest interval lasting 10 minutes and 24 seconds with an average rate of 132 bpm.  Some of the SVT may have been atrial tachycardia with variable block.  There was otherwise rare isolated ectopy with couplets.  Since I saw her, she did see Dr. Dennard Schaumann and stopped taking Prolia due to "locked jaw."  She tells me that she took metoprolol tartrate on 3 occasions since I last saw her.  She believes her palpitations are stress mediated.  She underwent an echo Doppler study in August 09, 2022 which showed hyperdynamic LV function with a EF 65 to 70% with normal RV function and normal valvular architecture.  She presents to the office for  follow-up evaluation.  Past Medical History:  Diagnosis Date   Hyperlipidemia    Osteopenia     Past Surgical History:  Procedure Laterality Date   ABDOMINAL HYSTERECTOMY     age 62, NO BSO   BREAST BIOPSY Bilateral 1996   benign   BREAST SURGERY     biopsy x 2, benign   BUNIONECTOMY Left    Carpel tunnel     FOOT SURGERY     total joint replacement- left foot after bunionectomy   FOOT SURGERY Right    correct bunionectomy done previously   HAMMER TOE SURGERY     TENNIS ELBOW RELEASE/NIRSCHEL PROCEDURE      Current Medications: Outpatient Medications Prior to Visit  Medication Sig Dispense Refill   acetaminophen (TYLENOL) 325 MG tablet Take 2  tablets (650 mg total) by mouth every 6 (six) hours as needed for mild pain, moderate pain or fever. 60 tablet 0   Calcium Carbonate-Vit D-Min (CALCIUM 1200 PO) Take 1,200 mg by mouth.     cholecalciferol (VITAMIN D) 1000 UNITS tablet Take 1,000 Units by mouth daily.     desonide (DESOWEN) 0.05 % cream Apply topically 2 (two) times daily. 30 g 0   gentamicin cream (GARAMYCIN) 0.1 % Apply 1 application topically 2 (two) times daily. 30 g 1   Melatonin 3 MG TABS Take 1 each by mouth at bedtime.     metoprolol tartrate (LOPRESSOR) 25 MG tablet Take 1 tablet (25 mg total) by mouth daily as needed. For palpations 30 tablet 3   pantoprazole (PROTONIX) 40 MG tablet TAKE 1 TABLET BY MOUTH EVERY DAY 90 tablet 1   simvastatin (ZOCOR) 10 MG tablet TAKE 1 TABLET BY MOUTH EVERY DAY 90 tablet 3   TURMERIC PO Take 1,800 mg by mouth daily.     metoprolol succinate (TOPROL-XL) 25 MG 24 hr tablet Take 1 tablet (25 mg total) by mouth daily. (Patient not taking: Reported on 07/27/2022) 30 tablet 3   No facility-administered medications prior to visit.     Allergies:   Amitriptyline, Asa [aspirin], Gabapentin, Ibuprofen, and Latex   Social History   Socioeconomic History   Marital status: Married    Spouse name: Not on file   Number of children: Not on file   Years of education: Not on file   Highest education level: Not on file  Occupational History   Not on file  Tobacco Use   Smoking status: Former    Types: Cigarettes    Quit date: 12/14/1997    Years since quitting: 24.7   Smokeless tobacco: Never  Vaping Use   Vaping Use: Never used  Substance and Sexual Activity   Alcohol use: No   Drug use: No   Sexual activity: Not Currently  Other Topics Concern   Not on file  Social History Narrative   Lives with husband   Caffeine use: Coffee- 3 cups per day      Right handed    Social Determinants of Health   Financial Resource Strain: Not on file  Food Insecurity: Not on file  Transportation  Needs: Not on file  Physical Activity: Not on file  Stress: Not on file  Social Connections: Not on file    Socially, she was born in [place].  She is married for 54 years.  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  She remotely smoked cigarettes for 20 years but quit smoking in March 1999.  She walks and does light weights and tries to exercise at least 5 days/week for 20 minutes at a time.  She has 3 dogs which she walks.   Family History:  The patient's family history includes Alzheimer's disease in her father; Breast cancer in her mother; Cancer in her mother; Depression in her mother; Early death (age of onset: 10) in her maternal grandfather.  Her mother died at age's 38 and had diabetes and breast cancer.  Father died at age 46 and had Alzheimer's disease, prostate CA, and had suffered a heart attack.  She has 1 brother age 65.  A sister died at age 52 with 73 CVA and she has 2 living sisters ages 97 and 3.  She has 2 children ages 16 and 74.  ROS General: Negative; No fevers, chills, or night sweats;  HEENT: Negative; No changes in vision or hearing, sinus congestion, difficulty swallowing Pulmonary: Negative; No cough, wheezing, shortness of breath, hemoptysis Cardiovascular: See HPI GI: GERD GU: Negative; No dysuria, hematuria, or difficulty voiding Musculoskeletal: Negative; no myalgias, joint pain, or weakness Hematologic/Oncology: Negative; no easy bruising, bleeding Endocrine: Negative; no heat/cold intolerance; no diabetes Neuro: Negative; no changes in balance, headaches Skin: Negative; No rashes or skin lesions Psychiatric: Negative; No behavioral problems, depression Sleep: Poor nonrestorative sleep, occasional daytime sleepiness, no bruxism, restless legs, hypnogognic hallucinations, no cataplexy An Epworth Sleepiness Scale score was calculated in the office today and this endorsed at 9 as shown below:  Epworth  Sleepiness Scale: Situation   Chance of Dozing/Sleeping (0 = never , 1 = slight chance , 2 = moderate chance , 3 = high chance )   sitting and reading 2   watching TV 2   sitting inactive in a public place 0   being a passenger in a motor vehicle for an hour or more 0   lying down in the afternoon 3   sitting and talking to someone 0   sitting quietly after lunch (no alcohol) 2   while stopped for a few minutes in traffic as the driver 0   Total Score  9     Other comprehensive 14 point system review is negative.   PHYSICAL EXAM:   VS:  BP 110/64   Pulse 73   Ht '5\' 3"'$  (1.6 m)   Wt 153 lb 9.6 oz (69.7 kg)   SpO2 98%   BMI 27.21 kg/m     Repeat blood pressure is by me was 112/64.  Wt Readings from Last 3 Encounters:  08/30/22 153 lb 9.6 oz (69.7 kg)  08/23/22 148 lb (67.1 kg)  07/27/22 151 lb 9.6 oz (68.8 kg)    General: Alert, oriented, no distress.  Skin: normal turgor, no rashes, warm and dry HEENT: Normocephalic, atraumatic. Pupils equal round and reactive to light; sclera anicteric; extraocular muscles intact;  Nose without nasal septal hypertrophy Mouth/Parynx benign; Mallinpatti scale 3 Neck: No JVD, no carotid bruits; normal carotid upstroke Lungs: clear to ausculatation and percussion; no wheezing or rales Chest wall: without tenderness to palpitation Heart: PMI not displaced, RRR, s1 s2 normal, 1/6 systolic murmur, no diastolic murmur, no rubs, gallops, thrills, or heaves Abdomen: soft, nontender; no hepatosplenomehaly, BS+; abdominal aorta nontender and not dilated by palpation. Back: no CVA tenderness Pulses 2+ Musculoskeletal: full range of motion, normal strength, no joint deformities Extremities: no clubbing cyanosis or edema, Homan's sign negative  Neurologic: grossly nonfocal; Cranial nerves grossly wnl Psychologic: Normal mood and affect   Studies/Labs Reviewed:  August 30, 2022 ECG (independently read by me): NSR at 73, no ectopy, nondiagnostic  T wave abnormality, no ectopy  July 27, 2022 ECG (independently read by me): NSR at 72, nonspecific  T wave abnormality; normal intervals  Recent Labs:    Latest Ref Rng & Units 06/26/2022    9:17 AM 03/06/2022    2:57 PM 07/21/2021   11:44 AM  BMP  Glucose 65 - 99 mg/dL 93  113  94   BUN 7 - 25 mg/dL '11  10  13   '$ Creatinine 0.60 - 1.00 mg/dL 0.74  0.70  0.67   BUN/Creat Ratio 6 - 22 (calc) SEE NOTE:  NOT APPLICABLE  NOT APPLICABLE   Sodium 937 - 146 mmol/L 140  141  140   Potassium 3.5 - 5.3 mmol/L 4.5  4.4  4.9   Chloride 98 - 110 mmol/L 104  103  103   CO2 20 - 32 mmol/L '23  29  28   '$ Calcium 8.6 - 10.4 mg/dL 9.7  10.0  10.4         Latest Ref Rng & Units 03/06/2022    2:57 PM 07/21/2021   11:44 AM 07/30/2020    8:25 AM  Hepatic Function  Total Protein 6.1 - 8.1 g/dL 7.2  7.1  7.4   AST 10 - 35 U/L '17  21  20   '$ ALT 6 - 29 U/L '11  13  13   '$ Total Bilirubin 0.2 - 1.2 mg/dL 0.4  0.6  0.8        Latest Ref Rng & Units 03/06/2022    2:57 PM 07/21/2021   11:44 AM 07/30/2020    8:25 AM  CBC  WBC 3.8 - 10.8 Thousand/uL 6.9  6.2  5.8   Hemoglobin 11.7 - 15.5 g/dL 13.0  13.3  13.4   Hematocrit 35.0 - 45.0 % 38.9  40.3  40.6   Platelets 140 - 400 Thousand/uL 367  333  354    Lab Results  Component Value Date   MCV 94.6 03/06/2022   MCV 93.7 07/21/2021   MCV 96.0 07/30/2020   Lab Results  Component Value Date   TSH 2.71 06/26/2022   No results found for: "HGBA1C"   BNP No results found for: "BNP"  ProBNP No results found for: "PROBNP"   Lipid Panel     Component Value Date/Time   CHOL 199 07/21/2021 1144   TRIG 171 (H) 07/21/2021 1144   HDL 61 07/21/2021 1144   CHOLHDL 3.3 07/21/2021 1144   VLDL 40 (H) 06/19/2017 1007   LDLCALC 109 (H) 07/21/2021 1144     RADIOLOGY: LONG TERM MONITOR (3-14 DAYS)  Result Date: 09/04/2022 Patch Wear Time:  13 days and 21 hours (2023-10-16T08:18:07-399 to 2023-10-30T06:09:59-0400) Patient had a min HR of 53 bpm, max HR  of 231 bpm, and avg HR of 76 bpm. Predominant underlying rhythm was Sinus Rhythm. 110 Supraventricular Tachycardia runs occurred, the run with the fastest interval lasting 12 beats with a max rate of 231 bpm, the longest lasting 10 mins 24 secs with an avg rate of 132 bpm. Some episodes of Supraventricular Tachycardia may be possible Atrial Tachycardia with variable block. Supraventricular Tachycardia was detected within +/- 45 seconds of symptomatic patient event(s). Isolated SVEs were rare (<1.0%), SVE Couplets were rare (<1.0%), and SVE Triplets were rare (<1.0%). Isolated VEs were rare (<1.0%, 1978), VE Couplets were rare (<1.0%, 6), and VE Triplets were rare (<1.0%, 1). Data reviewed, agree with  above.   ECHOCARDIOGRAM COMPLETE  Result Date: 08/09/2022    ECHOCARDIOGRAM REPORT   Patient Name:   MAKENZY KRIST Date of Exam: 08/09/2022 Medical Rec #:  [id number]         Height:       63.0 in Accession #:    [id number]        Weight:       151.6 lb Date of Birth:  [birth year]         BSA:          1.719 m Patient Age:    45 years          BP:           128/74 mmHg Patient Gender: F                 HR:           74 bpm. Exam Location:  [facility] Procedure: 2D Echo, 3D Echo, Cardiac Doppler, Color Doppler and Strain Analysis Indications:    R00.2 Palpitations  History:        Patient has prior history of Echocardiogram examinations, most                 recent 06/06/2002. Risk Factors:Dyslipidemia.  Sonographer:    Wilford Sports Rodgers-Jones RDCS Referring Phys: [place]  1. Left ventricular ejection fraction, by estimation, is 65 to 70%. Left ventricular ejection fraction by 3D volume is 66 %. The left ventricle has normal function. The left ventricle has no regional wall motion abnormalities. Left ventricular diastolic  parameters were normal. The average left ventricular global longitudinal strain is -25.3 %. The global longitudinal strain is normal.  2. Right ventricular systolic  function is normal. The right ventricular size is normal. There is normal pulmonary artery systolic pressure.  3. The mitral valve is grossly normal. No evidence of mitral valve regurgitation. No evidence of mitral stenosis.  4. The aortic valve is tricuspid. Aortic valve regurgitation is not visualized. No aortic stenosis is present.  5. The inferior vena cava is normal in size with greater than 50% respiratory variability, suggesting right atrial pressure of 3 mmHg. Comparison(s): Unable to review 2003 study for comparison. FINDINGS  Left Ventricle: Left ventricular ejection fraction, by estimation, is 65 to 70%. Left ventricular ejection fraction by 3D volume is 66 %. The left ventricle has normal function. The left ventricle has no regional wall motion abnormalities. The average left ventricular global longitudinal strain is -25.3 %. The global longitudinal strain is normal. The left ventricular internal cavity size was normal in size. There is no left ventricular hypertrophy. Left ventricular diastolic parameters were normal. Right Ventricle: The right ventricular size is normal. No increase in right ventricular wall thickness. Right ventricular systolic function is normal. There is normal pulmonary artery systolic pressure. The tricuspid regurgitant velocity is 2.63 m/s, and  with an assumed right atrial pressure of 3 mmHg, the estimated right ventricular systolic pressure is 86.5 mmHg. Left Atrium: Left atrial size was normal in size. Right Atrium: Right atrial size was normal in size. Pericardium: There is no evidence of pericardial effusion. Mitral Valve: The mitral valve is grossly normal. No evidence of mitral valve regurgitation. No evidence of mitral valve stenosis. Tricuspid Valve: The tricuspid valve is normal in structure. Tricuspid valve regurgitation is mild . No evidence of tricuspid stenosis. Aortic Valve: The aortic valve is tricuspid. Aortic valve regurgitation is not visualized. No aortic  stenosis is present. Pulmonic  Valve: The pulmonic valve was not well visualized. Pulmonic valve regurgitation is not visualized. No evidence of pulmonic stenosis. Aorta: The aortic root is normal in size and structure. Venous: The inferior vena cava is normal in size with greater than 50% respiratory variability, suggesting right atrial pressure of 3 mmHg. IAS/Shunts: The interatrial septum appears to be lipomatous. No atrial level shunt detected by color flow Doppler.  LEFT VENTRICLE PLAX 2D LVIDd:         4.10 cm         Diastology LVIDs:         2.10 cm         LV e' medial:    9.79 cm/s LV PW:         0.90 cm         LV E/e' medial:  11.7 LV IVS:        0.90 cm         LV e' lateral:   10.20 cm/s LVOT diam:     1.90 cm         LV E/e' lateral: 11.2 LV SV:         66 LV SV Index:   38              2D LVOT Area:     2.84 cm        Longitudinal                                Strain                                2D Strain GLS  -24.3 %                                (A2C):                                2D Strain GLS  -27.6 %                                (A3C):                                2D Strain GLS  -24.1 %                                (A4C):                                2D Strain GLS  -25.3 %                                Avg:                                 3D Volume EF  LV 3D EF:    Left                                             ventricul                                             ar                                             ejection                                             fraction                                             by 3D                                             volume is                                             66 %.                                 3D Volume EF:                                3D EF:        66 %                                LV EDV:       111 ml                                LV ESV:       38 ml                                 LV SV:        74 ml RIGHT VENTRICLE             IVC RV Basal diam:  3.20 cm     IVC diam: 1.40 cm RV S prime:     19.25 cm/s TAPSE (M-mode): 2.6 cm LEFT ATRIUM             Index        RIGHT ATRIUM  Index LA diam:        3.60 cm 2.09 cm/m   RA Area:     10.20 cm LA Vol (A2C):   30.0 ml 17.45 ml/m  RA Volume:   24.90 ml  14.49 ml/m LA Vol (A4C):   23.8 ml 13.85 ml/m LA Biplane Vol: 26.7 ml 15.53 ml/m  AORTIC VALVE LVOT Vmax:   120.50 cm/s LVOT Vmean:  75.350 cm/s LVOT VTI:    0.232 m  AORTA Ao Root diam: 2.90 cm MITRAL VALVE                TRICUSPID VALVE MV Area (PHT): 3.65 cm     TR Peak grad:   27.7 mmHg MV Decel Time: 208 msec     TR Vmax:        263.00 cm/s MV E velocity: 114.50 cm/s MV A velocity: 109.00 cm/s  SHUNTS MV E/A ratio:  1.05         Systemic VTI:  0.23 m                             Systemic Diam: 1.90 cm Rudean Haskell MD Electronically signed by Rudean Haskell MD Signature Date/Time: 08/09/2022/4:16:48 PM    Final      Additional studies/ records that were reviewed today include:   I have reviewed the records of Dr. Jenna Luo  Patch Wear Time:  13 days and 21 hours (2023-10-16T08:18:07-399 to 2023-10-30T06:09:59-0400)  Patient had a min HR of 53 bpm, max HR of 231 bpm, and avg HR of 76 bpm. Predominant underlying rhythm was Sinus Rhythm. 110 Supraventricular Tachycardia runs occurred, the run with the fastest interval lasting 12 beats with a max rate of 231 bpm, the  longest lasting 10 mins 24 secs with an avg rate of 132 bpm. Some episodes of Supraventricular Tachycardia may be possible Atrial Tachycardia with variable block. Supraventricular Tachycardia was detected within +/- 45 seconds of symptomatic patient  event(s). Isolated SVEs were rare (<1.0%), SVE Couplets were rare (<1.0%), and SVE Triplets were rare (<1.0%). Isolated VEs were rare (<1.0%, 1978), VE Couplets were rare (<1.0%, 6), and VE Triplets were rare (<1.0%, 1).   ASSESSMENT:    1.  History of SVT (supraventricular tachycardia)   2. Palpitation   3. Snoring   4. Poor sleep   5. Mixed hyperlipidemia   6. Gastroesophageal reflux disease without esophagitis     PLAN:  Ms. Santiago Graf is a very pleasant 71 year old female who previously worked in Pharmacist, community at [employer].  She had developed an episode of SVT over 20 years ago and may have undergone cardioversion at that time and remotely had seen Dr. Tamala Julian.  She had been doing exceptionally well until the last several months where she has experienced episodes of short-lived rapid onset and offset tachycardia highly suggestive of bursts of SVT.  Apparently her longest episode lasted approximately 30 minutes and self resolved.  Remotely, she had been given a medication for her episode and apparently this had resulted in very low  blood pressure leading to her discontinuance.  At my initial office visit, I recommended that she undergo a 2D echo Doppler study and scheduled her for a 2-week Zio patch monitor.  She was given a prescription for as needed metoprolol to tartrate which she has taken on 3 occasions.  I reviewed her Zio patch monitor with her in detail.  Her predominant rhythm was sinus rhythm with  an average rate at 76 bpm.  She had normal sinus bradycardia with sleep with slowest heart rate at 53 bpm at 4:38 AM and had sinus tachycardia with activity.  She did have frequent short bursts of nonsustained VT without fastest interval lasting 12 beats with a variable rate up to 231 and the longest interval lasting almost 10-1/2 minutes with an average rate at 132.  She had rare isolated PACs with rare couplet as well as isolated PVC with rare couplet and 1 triplet.  Presently, I am recommending she initiate Toprol-XL and for the first 4 days she will take 12.5 mg and as long as her heart rate is stable and not significantly bradycardic with low blood pressure she will titrate to 25 mg.  Due to my concerns for obstructive  sleep apnea she had initially undergone a home sleep study.  However apparently this did not record we will now try to arrange for her to have an in lab split-night study for evaluation of obstructive sleep apnea.  I reviewed her echo Doppler study which was normal with hyperdynamic LV function with EF 65 to 70%, and normal RV function with normal valvular architecture.  She continues to be on simvastatin low-dose at 10 mg for hyperlipidemia and is on pantoprazole for GERD.  I will see her in 2 to 3 months for follow-up evaluation or sooner as needed.   Medication Adjustments/Labs and Tests Ordered: Current medicines are reviewed at length with the patient today.  Concerns regarding medicines are outlined above.  Medication changes, Labs and Tests ordered today are listed in the Patient Instructions below. Patient Instructions  Medication Instructions:  START the Metoprolol Succinate- take 0.5 tablet for the first 4 days, then increase to a whole tablet.   *If you need a refill on your cardiac medications before your next appointment, please call your pharmacy*   Testing/Procedures: Your physician has recommended that you have a sleep study. This test records several body functions during sleep, including: brain activity, eye movement, oxygen and carbon dioxide blood levels, heart rate and rhythm, breathing rate and rhythm, the flow of air through your mouth and nose, snoring, body muscle movements, and chest and belly movement.   Follow-Up: At [facility], you and your health needs are our priority.  As part of our continuing mission to provide you with exceptional heart care, we have created designated Provider Care Teams.  These Care Teams include your primary Cardiologist (physician) and Advanced Practice Providers (APPs -  Physician Assistants and Nurse Practitioners) who all work together to provide you with the care you need, when you need it.  We recommend signing up for the  patient portal called "MyChart".  Sign up information is provided on this After Visit Summary.  MyChart is used to connect with patients for Virtual Visits (Telemedicine).  Patients are able to view lab/test results, encounter notes, upcoming appointments, etc.  Non-urgent messages can be sent to your provider as well.   To learn more about what you can do with MyChart, go to [email or web address].    Your next appointment:   3 month(s)  The format for your next appointment:   In Person  Provider:   Shelva Majestic, MD          Signed, Shelva Majestic, MD  09/04/2022 12:52 PM    [facility] [address], [place], [place], [place]  [address] Phone: [phone]

## 2022-09-04 ENCOUNTER — Encounter: Payer: Self-pay | Admitting: Cardiovascular Disease

## 2022-09-26 ENCOUNTER — Other Ambulatory Visit: Payer: Self-pay | Admitting: Family Medicine

## 2022-10-03 ENCOUNTER — Ambulatory Visit (INDEPENDENT_AMBULATORY_CARE_PROVIDER_SITE_OTHER): Payer: PPO

## 2022-10-03 VITALS — Ht 63.0 in | Wt 153.0 lb

## 2022-10-03 DIAGNOSIS — Z0000 Encounter for general adult medical examination without abnormal findings: Secondary | ICD-10-CM | POA: Diagnosis not present

## 2022-10-03 NOTE — Patient Instructions (Signed)
[name] , Thank you for taking time to come for your Medicare Wellness Visit. I appreciate your ongoing commitment to your health goals. Please review the following plan we discussed and let me know if I can assist you in the future.   These are the goals we discussed:  Goals      DIET - INCREASE WATER INTAKE        This is a list of the screening recommended for you and due dates:  Health Maintenance  Topic Date Due   Medicare Annual Wellness Visit  10/04/2023   DTaP/Tdap/Td vaccine (2 - Td or Tdap) 03/16/2024   Cologuard (Stool DNA test)  05/06/2024   Mammogram  06/13/2024   Pneumonia Vaccine  Completed   Flu Shot  Completed   DEXA scan (bone density measurement)  Completed   Hepatitis C Screening: USPSTF Recommendation to screen - Ages 70-79 yo.  Completed   Zoster (Shingles) Vaccine  Completed   HPV Vaccine  Aged Out   Colon Cancer Screening  Discontinued   COVID-19 Vaccine  Discontinued    Advanced directives: Please bring a copy of your health care power of attorney and living will to the office to be added to your chart at your convenience.   Conditions/risks identified: Aim for 30 minutes of exercise or brisk walking, 6-8 glasses of water, and 5 servings of fruits and vegetables each day.   Next appointment: Follow up in one year for your annual wellness visit    Preventive Care 65 Years and Older, Female Preventive care refers to lifestyle choices and visits with your health care provider that can promote health and wellness. What does preventive care include? A yearly physical exam. This is also called an annual well check. Dental exams once or twice a year. Routine eye exams. Ask your health care provider how often you should have your eyes checked. Personal lifestyle choices, including: Daily care of your teeth and gums. Regular physical activity. Eating a healthy diet. Avoiding tobacco and drug use. Limiting alcohol use. Practicing safe sex. Taking  low-dose aspirin every day. Taking vitamin and mineral supplements as recommended by your health care provider. What happens during an annual well check? The services and screenings done by your health care provider during your annual well check will depend on your age, overall health, lifestyle risk factors, and family history of disease. Counseling  Your health care provider may ask you questions about your: Alcohol use. Tobacco use. Drug use. Emotional well-being. Home and relationship well-being. Sexual activity. Eating habits. History of falls. Memory and ability to understand (cognition). Work and work Statistician. Reproductive health. Screening  You may have the following tests or measurements: Height, weight, and BMI. Blood pressure. Lipid and cholesterol levels. These may be checked every 5 years, or more frequently if you are over 29 years old. Skin check. Lung cancer screening. You may have this screening every year starting at age 25 if you have a 30-pack-year history of smoking and currently smoke or have quit within the past 15 years. Fecal occult blood test (FOBT) of the stool. You may have this test every year starting at age 38. Flexible sigmoidoscopy or colonoscopy. You may have a sigmoidoscopy every 5 years or a colonoscopy every 10 years starting at age 35. Hepatitis C blood test. Hepatitis B blood test. Sexually transmitted disease (STD) testing. Diabetes screening. This is done by checking your blood sugar (glucose) after you have not eaten for a while (fasting). You may have  this done every 1-3 years. Bone density scan. This is done to screen for osteoporosis. You may have this done starting at age 43. Mammogram. This may be done every 1-2 years. Talk to your health care provider about how often you should have regular mammograms. Talk with your health care provider about your test results, treatment options, and if necessary, the need for more tests. Vaccines   Your health care provider may recommend certain vaccines, such as: Influenza vaccine. This is recommended every year. Tetanus, diphtheria, and acellular pertussis (Tdap, Td) vaccine. You may need a Td booster every 10 years. Zoster vaccine. You may need this after age 82. Pneumococcal 13-valent conjugate (PCV13) vaccine. One dose is recommended after age 68. Pneumococcal polysaccharide (PPSV23) vaccine. One dose is recommended after age 52. Talk to your health care provider about which screenings and vaccines you need and how often you need them. This information is not intended to replace advice given to you by your health care provider. Make sure you discuss any questions you have with your health care provider. Document Released: 10/29/2015 Document Revised: 06/21/2016 Document Reviewed: 08/03/2015 Elsevier Interactive Patient Education  2017 [place] Prevention in the Home Falls can cause injuries. They can happen to people of all ages. There are many things you can do to make your home safe and to help prevent falls. What can I do on the outside of my home? Regularly fix the edges of walkways and driveways and fix any cracks. Remove anything that might make you trip as you walk through a door, such as a raised step or threshold. Trim any bushes or trees on the path to your home. Use bright outdoor lighting. Clear any walking paths of anything that might make someone trip, such as rocks or tools. Regularly check to see if handrails are loose or broken. Make sure that both sides of any steps have handrails. Any raised decks and porches should have guardrails on the edges. Have any leaves, snow, or ice cleared regularly. Use sand or salt on walking paths during winter. Clean up any spills in your garage right away. This includes oil or grease spills. What can I do in the bathroom? Use night lights. Install grab bars by the toilet and in the tub and shower. Do not use towel  bars as grab bars. Use non-skid mats or decals in the tub or shower. If you need to sit down in the shower, use a plastic, non-slip stool. Keep the floor dry. Clean up any water that spills on the floor as soon as it happens. Remove soap buildup in the tub or shower regularly. Attach bath mats securely with double-sided non-slip rug tape. Do not have throw rugs and other things on the floor that can make you trip. What can I do in the bedroom? Use night lights. Make sure that you have a light by your bed that is easy to reach. Do not use any sheets or blankets that are too big for your bed. They should not hang down onto the floor. Have a firm chair that has side arms. You can use this for support while you get dressed. Do not have throw rugs and other things on the floor that can make you trip. What can I do in the kitchen? Clean up any spills right away. Avoid walking on wet floors. Keep items that you use a lot in easy-to-reach places. If you need to reach something above you, use a strong step stool  that has a grab bar. Keep electrical cords out of the way. Do not use floor polish or wax that makes floors slippery. If you must use wax, use non-skid floor wax. Do not have throw rugs and other things on the floor that can make you trip. What can I do with my stairs? Do not leave any items on the stairs. Make sure that there are handrails on both sides of the stairs and use them. Fix handrails that are broken or loose. Make sure that handrails are as long as the stairways. Check any carpeting to make sure that it is firmly attached to the stairs. Fix any carpet that is loose or worn. Avoid having throw rugs at the top or bottom of the stairs. If you do have throw rugs, attach them to the floor with carpet tape. Make sure that you have a light switch at the top of the stairs and the bottom of the stairs. If you do not have them, ask someone to add them for you. What else can I do to help  prevent falls? Wear shoes that: Do not have high heels. Have rubber bottoms. Are comfortable and fit you well. Are closed at the toe. Do not wear sandals. If you use a stepladder: Make sure that it is fully opened. Do not climb a closed stepladder. Make sure that both sides of the stepladder are locked into place. Ask someone to hold it for you, if possible. Clearly mark and make sure that you can see: Any grab bars or handrails. First and last steps. Where the edge of each step is. Use tools that help you move around (mobility aids) if they are needed. These include: Canes. Walkers. Scooters. Crutches. Turn on the lights when you go into a dark area. Replace any light bulbs as soon as they burn out. Set up your furniture so you have a clear path. Avoid moving your furniture around. If any of your floors are uneven, fix them. If there are any pets around you, be aware of where they are. Review your medicines with your doctor. Some medicines can make you feel dizzy. This can increase your chance of falling. Ask your doctor what other things that you can do to help prevent falls. This information is not intended to replace advice given to you by your health care provider. Make sure you discuss any questions you have with your health care provider. Document Released: 07/29/2009 Document Revised: 03/09/2016 Document Reviewed: 11/06/2014 Elsevier Interactive Patient Education  2017 [employer].

## 2022-10-03 NOTE — Progress Notes (Signed)
Subjective:   [name] is a 71 y.o. female who presents for Medicare Annual (Subsequent) preventive examination.  I connected with  NEYLA GAUNTT on 10/03/22 by a audio enabled telemedicine application and verified that I am speaking with the correct person using two identifiers.  Patient Location: Home  Provider Location: Office/Clinic  I discussed the limitations of evaluation and management by telemedicine. The patient expressed understanding and agreed to proceed.  Review of Systems     Cardiac Risk Factors include: advanced age (>22mn, >>70women);dyslipidemia     Objective:    Today's Vitals   10/03/22 1446  Weight: 153 lb (69.4 kg)  Height: '5\' 3"'$  (1.6 m)   Body mass index is 27.1 kg/m.     10/03/2022    2:49 PM 08/19/2021    9:34 AM 02/02/2016   12:01 PM  Advanced Directives  Does Patient Have a Medical Advance Directive? Yes Yes No  Type of Advance Directive Living will;Healthcare Power of [place]Living will   Does patient want to make changes to medical advance directive? No - Patient declined No - Patient declined   Copy of [place] Chart? No - copy requested No - copy requested   Would patient like information on creating a medical advance directive?  No - Patient declined No - patient declined information    Current Medications (verified) Outpatient Encounter Medications as of 10/03/2022  Medication Sig   acetaminophen (TYLENOL) 325 MG tablet Take 2 tablets (650 mg total) by mouth every 6 (six) hours as needed for mild pain, moderate pain or fever.   Calcium Carbonate-Vit D-Min (CALCIUM 1200 PO) Take 1,200 mg by mouth.   cholecalciferol (VITAMIN D) 1000 UNITS tablet Take 1,000 Units by mouth daily.   desonide (DESOWEN) 0.05 % cream Apply topically 2 (two) times daily.   gentamicin cream (GARAMYCIN) 0.1 % Apply 1 application topically 2 (two) times daily.   Melatonin 3 MG TABS Take 1 each by  mouth at bedtime.   metoprolol succinate (TOPROL-XL) 25 MG 24 hr tablet Take 25 mg by mouth daily.   pantoprazole (PROTONIX) 40 MG tablet TAKE 1 TABLET BY MOUTH EVERY DAY   simvastatin (ZOCOR) 10 MG tablet TAKE 1 TABLET BY MOUTH EVERY DAY   TURMERIC PO Take 1,800 mg by mouth daily.   metoprolol tartrate (LOPRESSOR) 25 MG tablet Take 1 tablet (25 mg total) by mouth daily as needed. For palpations (Patient not taking: Reported on 10/03/2022)   No facility-administered encounter medications on file as of 10/03/2022.    Allergies (verified) Amitriptyline, Asa [aspirin], Gabapentin, Ibuprofen, and Latex   History: Past Medical History:  Diagnosis Date   Hyperlipidemia    Osteopenia    Past Surgical History:  Procedure Laterality Date   ABDOMINAL HYSTERECTOMY     age 71 NO BSO   BREAST BIOPSY Bilateral 1996   benign   BREAST SURGERY     biopsy x 2, benign   BUNIONECTOMY Left    Carpel tunnel     FOOT SURGERY     total joint replacement- left foot after bunionectomy   FOOT SURGERY Right    correct bunionectomy done previously   HAMMER TOE SURGERY     TENNIS ELBOW RELEASE/NIRSCHEL PROCEDURE     Family History  Problem Relation Age of Onset   Depression Mother    Cancer Mother        breast s/p mastectomy   Breast cancer Mother  Alzheimer's disease Father    Early death Maternal Grandfather 72       [place]   Social History   Socioeconomic History   Marital status: Married    Spouse name: Not on file   Number of children: Not on file   Years of education: Not on file   Highest education level: Not on file  Occupational History   Not on file  Tobacco Use   Smoking status: Former    Types: Cigarettes    Quit date: 12/14/1997    Years since quitting: 24.8   Smokeless tobacco: Never  Vaping Use   Vaping Use: Never used  Substance and Sexual Activity   Alcohol use: No   Drug use: No   Sexual activity: Not Currently  Other Topics Concern   Not on file   Social History Narrative   Lives with husband   Caffeine use: Coffee- 3 cups per day      Right handed    Social Determinants of Health   Financial Resource Strain: Low Risk  (10/03/2022)   Overall Financial Resource Strain (CARDIA)    Difficulty of Paying Living Expenses: Not hard at all  Food Insecurity: No Food Insecurity (10/03/2022)   Hunger Vital Sign    Worried About Running Out of Food in the Last Year: Never true    [place] in the Last Year: Never true  Transportation Needs: No Transportation Needs (10/03/2022)   PRAPARE - Hydrologist (Medical): No    Lack of Transportation (Non-Medical): No  Physical Activity: Sufficiently Active (10/03/2022)   Exercise Vital Sign    Days of Exercise per Week: 5 days    Minutes of Exercise per Session: 40 min  Stress: No Stress Concern Present (10/03/2022)   [place]    Feeling of Stress : Not at all  Social Connections: [place] (10/03/2022)   Social Connection and Isolation Panel [NHANES]    Frequency of Communication with Friends and Family: More than three times a week    Frequency of Social Gatherings with Friends and Family: Three times a week    Attends Religious Services: More than 4 times per year    Active Member of Clubs or Organizations: Yes    Attends Music therapist: More than 4 times per year    Marital Status: Married    Tobacco Counseling Counseling given: Not Answered   Clinical Intake:  Pre-visit preparation completed: Yes  Pain : No/denies pain  Diabetes: No  How often do you need to have someone help you when you read instructions, pamphlets, or other written materials from your doctor or pharmacy?: 1 - Never  Diabetic?No  Interpreter Needed?: No  Information entered by :: Denman George LPN   Activities of Daily Living    10/03/2022    2:49 PM  In your present  state of health, do you have any difficulty performing the following activities:  Hearing? 0  Vision? 0  Difficulty concentrating or making decisions? 0  Walking or climbing stairs? 0  Dressing or bathing? 0  Doing errands, shopping? 0  Preparing Food and eating ? N  Using the Toilet? N  In the past six months, have you accidently leaked urine? N  Do you have problems with loss of bowel control? N  Managing your Medications? N  Managing your Finances? N  Housekeeping or managing your Housekeeping? N  Patient Care Team: Susy Frizzle, MD as PCP - General (Family Medicine) [facility], [place] Harriett Sine, MD as Consulting Physician (Dermatology) Edrick Kins, DPM as Consulting Physician (Podiatry) Mammography, [facility] (Diagnostic Radiology)  Indicate any recent Medical Services you may have received from other than [facility] providers in the past year (date may be approximate).     Assessment:   This is a routine wellness examination for Alaysiah.  Hearing/Vision screen Hearing Screening - Comments:: No concerns at this time  Vision Screening - Comments::  up to date with routine eye exams with [facility]    Dietary issues and exercise activities discussed: Current Exercise Habits: Home exercise routine, Type of exercise: walking, Time (Minutes): 40, Frequency (Times/Week): 5, Weekly Exercise (Minutes/Week): 200, Intensity: Mild   Goals Addressed             This Visit's Progress    DIET - INCREASE WATER INTAKE        Depression Screen    10/03/2022    2:48 PM 07/12/2022   11:51 AM 06/26/2022    8:59 AM 03/06/2022    2:35 PM 08/19/2021    9:32 AM 08/03/2020    8:18 AM 07/31/2019    3:04 PM  PHQ 2/9 Scores  PHQ - 2 Score 0 0 0 0 0 0 0  PHQ- 9 Score  0         Fall Risk    10/03/2022    2:47 PM 07/12/2022   11:51 AM 06/26/2022    9:00 AM 03/06/2022    2:35 PM 08/19/2021    9:31 AM  Fall Risk   Falls in the past year? 0 0 0 0 0  Number falls  in past yr: 0 0 0  0  Injury with Fall? 0 0 0  0  Risk for fall due to : No Fall Risks  No Fall Risks  Impaired mobility  Risk for fall due to: Comment     currently in post op boot due to nail removal  Follow up Falls evaluation completed;Education provided;Falls prevention discussed  Falls prevention discussed Falls evaluation completed Falls evaluation completed    FALL RISK PREVENTION PERTAINING TO THE HOME:  Any stairs in or around the home? Yes  If so, are there any without handrails? No  Home free of loose throw rugs in walkways, pet beds, electrical cords, etc? Yes  Adequate lighting in your home to reduce risk of falls? Yes   ASSISTIVE DEVICES UTILIZED TO PREVENT FALLS:  Life alert? No  Use of a cane, walker or w/c? No  Grab bars in the bathroom? Yes  Shower chair or bench in shower? No  Elevated toilet seat or a handicapped toilet? Yes   TIMED UP AND GO:  Was the test performed? No . Telephonic visit   Cognitive Function:        10/03/2022    2:49 PM 08/03/2020    8:24 AM  6CIT Screen  What Year? 0 points 0 points  What month? 0 points 0 points  What time? 0 points 0 points  Count back from 20 0 points 0 points  Months in reverse 0 points 0 points  Repeat phrase 0 points 0 points  Total Score 0 points 0 points    Immunizations Immunization History  Administered Date(s) Administered   Fluad Quad(high Dose 65+) 07/31/2019, 08/03/2020, 07/21/2021, 08/15/2022   Influenza, High Dose Seasonal PF 06/14/2016   Influenza,inj,Quad PF,6+ Mos 07/15/2015, 06/19/2017, 07/04/2018  Influenza-Unspecified 07/16/2014   PFIZER(Purple Top)SARS-COV-2 Vaccination 12/15/2019, 01/13/2020   Pneumococcal Conjugate-13 05/29/2016   Pneumococcal Polysaccharide-23 12/15/2014   Tdap 03/16/2014   Zoster Recombinat (Shingrix) 07/17/2018, 09/20/2018    TDAP status: Up to date  Flu Vaccine status: Up to date  Pneumococcal vaccine status: Up to date  Covid-19 vaccine status:  Completed vaccines  Qualifies for Shingles Vaccine? Yes   Zostavax completed No   Shingrix Completed?: Yes  Screening Tests Health Maintenance  Topic Date Due   Medicare Annual Wellness (AWV)  10/04/2023   DTaP/Tdap/Td (2 - Td or Tdap) 03/16/2024   Fecal DNA (Cologuard)  05/06/2024   MAMMOGRAM  06/13/2024   Pneumonia Vaccine 25+ Years old  Completed   INFLUENZA VACCINE  Completed   DEXA SCAN  Completed   Hepatitis C Screening  Completed   Zoster Vaccines- Shingrix  Completed   HPV VACCINES  Aged Out   COLONOSCOPY (Pts 45-7yr Insurance coverage will need to be confirmed)  Discontinued   COVID-19 Vaccine  Discontinued    Health Maintenance  There are no preventive care reminders to display for this patient.   Colorectal cancer screening: Type of screening: Cologuard. Completed 05/06/21. Repeat every 3 years  Mammogram status: Completed 06/13/22. Repeat every year  Bone Density status: Completed 07/12/22. Results reflect: Bone density results: OSTEOPOROSIS. Repeat every 2 years. ([place] [facility]  Lung Cancer Screening: (Low Dose CT Chest recommended if Age 72-80years, 30 pack-year currently smoking OR have quit w/in 15years.) does not qualify.   Lung Cancer Screening Referral: n/a  Additional Screening:  Hepatitis C Screening: does qualify; Completed 06/19/17  Vision Screening: Recommended annual ophthalmology exams for early detection of glaucoma and other disorders of the eye. Is the patient up to date with their annual eye exam?  Yes  Who is the provider or what is the name of the [facility] which the patient [facility]  If pt is not established with a provider, would they like to be referred to a provider to establish care? No .   Dental Screening: Recommended annual dental exams for proper oral hygiene  Community Resource Referral / Chronic Care Management: CRR required this visit?  No   CCM required this visit?  No       Plan:     I have personally reviewed and noted the following in the patient's chart:   Medical and social history Use of alcohol, tobacco or illicit drugs  Current medications and supplements including opioid prescriptions. Patient is not currently taking opioid prescriptions. Functional ability and status Nutritional status Physical activity Advanced directives List of other physicians Hospitalizations, surgeries, and ER visits in previous 12 months Vitals Screenings to include cognitive, depression, and falls Referrals and appointments  In addition, I have reviewed and discussed with patient certain preventive protocols, quality metrics, and best practice recommendations. A written personalized care plan for preventive services as well as general preventive health recommendations were provided to patient.     SVanetta Mulders LPN   [id number]  Due to this being a virtual visit, the after visit summary with patients personalized plan was offered to patient via mail or my-chart.  Patient would like to access on my-chart  Nurse Notes: No concerns

## 2022-10-23 ENCOUNTER — Other Ambulatory Visit: Payer: Self-pay | Admitting: Cardiovascular Disease

## 2022-10-23 DIAGNOSIS — L821 Other seborrheic keratosis: Secondary | ICD-10-CM | POA: Diagnosis not present

## 2022-10-23 DIAGNOSIS — Z85828 Personal history of other malignant neoplasm of skin: Secondary | ICD-10-CM | POA: Diagnosis not present

## 2022-10-23 DIAGNOSIS — L570 Actinic keratosis: Secondary | ICD-10-CM | POA: Diagnosis not present

## 2022-10-23 DIAGNOSIS — L814 Other melanin hyperpigmentation: Secondary | ICD-10-CM | POA: Diagnosis not present

## 2022-11-02 ENCOUNTER — Encounter: Payer: Self-pay | Admitting: Family Medicine

## 2022-11-03 ENCOUNTER — Encounter: Payer: Self-pay | Admitting: Family Medicine

## 2022-11-03 ENCOUNTER — Ambulatory Visit (INDEPENDENT_AMBULATORY_CARE_PROVIDER_SITE_OTHER): Payer: PPO | Admitting: Family Medicine

## 2022-11-03 VITALS — BP 126/82 | HR 73 | Temp 98.7°F | Ht 63.0 in | Wt 153.2 lb

## 2022-11-03 DIAGNOSIS — E7800 Pure hypercholesterolemia, unspecified: Secondary | ICD-10-CM | POA: Diagnosis not present

## 2022-11-03 DIAGNOSIS — Z0001 Encounter for general adult medical examination with abnormal findings: Secondary | ICD-10-CM

## 2022-11-03 DIAGNOSIS — I471 Supraventricular tachycardia, unspecified: Secondary | ICD-10-CM

## 2022-11-03 DIAGNOSIS — Z0000 Encounter for general adult medical examination without abnormal findings: Secondary | ICD-10-CM | POA: Diagnosis not present

## 2022-11-03 DIAGNOSIS — M818 Other osteoporosis without current pathological fracture: Secondary | ICD-10-CM | POA: Diagnosis not present

## 2022-11-03 NOTE — Progress Notes (Signed)
Subjective:    Patient ID: [name], female    DOB: 12-30-1950, 72 y.o.   MRN: [id number]  HPI Patient is a very pleasant 72 year old white female who is here today for complete physical exam.  Patient was taking Prolia for osteoporosis.  She discontinued Prolia due to growths in her mouth that was felt to be related to this according to her oral surgeon.  She has not experienced any other lesions in her mouth since stopping the medication.  Her bone density test last year was stable.  Mammogram was performed in August.  Cologuard was performed in 2022.  She does not require Pap smear.  She denies any chest pain shortness of breath or dyspnea on exertion.  She denies any depression, falls, or memory loss Abstract on 11/02/2022  Component Date Value Ref Range Status   HM Dexa Scan 07/12/2022 oteopenia   Final   T-score: -2.1, BMD 0.814   Immunization History  Administered Date(s) Administered   Fluad Quad(high Dose 65+) 07/31/2019, 08/03/2020, 07/21/2021, 08/15/2022   Influenza, High Dose Seasonal PF 06/14/2016   Influenza,inj,Quad PF,6+ Mos 07/15/2015, 06/19/2017, 07/04/2018   Influenza-Unspecified 07/16/2014   PFIZER(Purple Top)SARS-COV-2 Vaccination 12/15/2019, 01/13/2020   Pneumococcal Conjugate-13 05/29/2016   Pneumococcal Polysaccharide-23 12/15/2014   Tdap 03/16/2014   Zoster Recombinat (Shingrix) 07/17/2018, 09/20/2018    Immunization History  Administered Date(s) Administered   Fluad Quad(high Dose 65+) 07/31/2019, 08/03/2020, 07/21/2021, 08/15/2022   Influenza, High Dose Seasonal PF 06/14/2016   Influenza,inj,Quad PF,6+ Mos 07/15/2015, 06/19/2017, 07/04/2018   Influenza-Unspecified 07/16/2014   PFIZER(Purple Top)SARS-COV-2 Vaccination 12/15/2019, 01/13/2020   Pneumococcal Conjugate-13 05/29/2016   Pneumococcal Polysaccharide-23 12/15/2014   Tdap 03/16/2014   Zoster Recombinat (Shingrix) 07/17/2018, 09/20/2018    Past Medical History:  Diagnosis Date    Hyperlipidemia    Osteopenia    Past Surgical History:  Procedure Laterality Date   ABDOMINAL HYSTERECTOMY     age 89, NO BSO   BREAST BIOPSY Bilateral 1996   benign   BREAST SURGERY     biopsy x 2, benign   BUNIONECTOMY Left    Carpel tunnel     FOOT SURGERY     total joint replacement- left foot after bunionectomy   FOOT SURGERY Right    correct bunionectomy done previously   HAMMER TOE SURGERY     TENNIS ELBOW RELEASE/NIRSCHEL PROCEDURE     Current Outpatient Medications on File Prior to Visit  Medication Sig Dispense Refill   acetaminophen (TYLENOL) 325 MG tablet Take 2 tablets (650 mg total) by mouth every 6 (six) hours as needed for mild pain, moderate pain or fever. 60 tablet 0   Calcium Carbonate-Vit D-Min (CALCIUM 1200 PO) Take 1,200 mg by mouth.     cholecalciferol (VITAMIN D) 1000 UNITS tablet Take 1,000 Units by mouth daily.     desonide (DESOWEN) 0.05 % cream Apply topically 2 (two) times daily. 30 g 0   gentamicin cream (GARAMYCIN) 0.1 % Apply 1 application topically 2 (two) times daily. 30 g 1   Melatonin 3 MG TABS Take 1 each by mouth at bedtime.     metoprolol succinate (TOPROL-XL) 25 MG 24 hr tablet Take 25 mg by mouth daily.     pantoprazole (PROTONIX) 40 MG tablet TAKE 1 TABLET BY MOUTH EVERY DAY 90 tablet 1   simvastatin (ZOCOR) 10 MG tablet TAKE 1 TABLET BY MOUTH EVERY DAY 90 tablet 3   TURMERIC PO Take 1,800 mg by mouth daily.     No  current facility-administered medications on file prior to visit.   Allergies  Allergen Reactions   Amitriptyline     Very tired, fatigued   Asa [Aspirin] Other (See Comments)    Stomach irritation   Gabapentin     Dizzy, very tired   Ibuprofen Other (See Comments)    Stomach irritation   Latex    Social History   Socioeconomic History   Marital status: Married    Spouse name: Not on file   Number of children: Not on file   Years of education: Not on file   Highest education level: Not on file  Occupational  History   Not on file  Tobacco Use   Smoking status: Former    Types: Cigarettes    Quit date: 12/14/1997    Years since quitting: 24.9   Smokeless tobacco: Never  Vaping Use   Vaping Use: Never used  Substance and Sexual Activity   Alcohol use: No   Drug use: No   Sexual activity: Not Currently  Other Topics Concern   Not on file  Social History Narrative   Lives with husband   Caffeine use: Coffee- 3 cups per day      Right handed    Social Determinants of Health   Financial Resource Strain: Low Risk  (10/03/2022)   Overall Financial Resource Strain (CARDIA)    Difficulty of Paying Living Expenses: Not hard at all  Food Insecurity: No Food Insecurity (10/03/2022)   Hunger Vital Sign    Worried About Running Out of Food in the Last Year: Never true    [place] in the Last Year: Never true  Transportation Needs: No Transportation Needs (10/03/2022)   PRAPARE - Hydrologist (Medical): No    Lack of Transportation (Non-Medical): No  Physical Activity: Sufficiently Active (10/03/2022)   Exercise Vital Sign    Days of Exercise per Week: 5 days    Minutes of Exercise per Session: 40 min  Stress: No Stress Concern Present (10/03/2022)   [place]    Feeling of Stress : Not at all  Social Connections: [place] (10/03/2022)   Social Connection and Isolation Panel [NHANES]    Frequency of Communication with Friends and Family: More than three times a week    Frequency of Social Gatherings with Friends and Family: Three times a week    Attends Religious Services: More than 4 times per year    Active Member of Clubs or Organizations: Yes    Attends Archivist Meetings: More than 4 times per year    Marital Status: Married  Human resources officer Violence: Not At Risk (10/03/2022)   Humiliation, Afraid, Rape, and Kick questionnaire    Fear of Current or  Ex-Partner: No    Emotionally Abused: No    Physically Abused: No    Sexually Abused: No   Family History  Problem Relation Age of Onset   Depression Mother    Cancer Mother        breast s/p mastectomy   Breast cancer Mother    Alzheimer's disease Father    Early death Maternal Grandfather 32       [place] Accident     Review of Systems  All other systems reviewed and are negative.      Objective:   Physical Exam Vitals reviewed.  Constitutional:      General: She is not in acute distress.  Appearance: She is well-developed. She is not diaphoretic.  HENT:     Head: Normocephalic and atraumatic.     Right Ear: External ear normal.     Left Ear: External ear normal.     Nose: Nose normal.     Mouth/Throat:     Pharynx: No oropharyngeal exudate.  Eyes:     General: No scleral icterus.       Right eye: No discharge.        Left eye: No discharge.     Conjunctiva/sclera: Conjunctivae normal.     Pupils: Pupils are equal, round, and reactive to light.  Neck:     Thyroid: No thyromegaly.     Vascular: No JVD.     Trachea: No tracheal deviation.  Cardiovascular:     Rate and Rhythm: Normal rate and regular rhythm.     Heart sounds: Normal heart sounds. No murmur heard.    No friction rub. No gallop.  Pulmonary:     Effort: Pulmonary effort is normal. No respiratory distress.     Breath sounds: Normal breath sounds. No stridor. No wheezing or rales.  Chest:     Chest wall: No tenderness.  Abdominal:     General: Bowel sounds are normal. There is no distension.     Palpations: Abdomen is soft. There is no mass.     Tenderness: There is no abdominal tenderness. There is no guarding or rebound.  Musculoskeletal:        General: No tenderness. Normal range of motion.     Cervical back: Normal range of motion and neck supple.  Lymphadenopathy:     Cervical: No cervical adenopathy.  Skin:    General: Skin is warm.     Coloration: Skin is not pale.     Findings: No  erythema or rash.  Neurological:     Mental Status: She is alert and oriented to person, place, and time.     Cranial Nerves: No cranial nerve deficit.     Motor: No abnormal muscle tone.     Coordination: Coordination normal.     Deep Tendon Reflexes: Reflexes are normal and symmetric.  Psychiatric:        Behavior: Behavior normal.        Thought Content: Thought content normal.        Judgment: Judgment normal.           Assessment & Plan:  Encounter for Medicare annual wellness exam  Paroxysmal supraventricular tachycardia  Pure hypercholesterolemia - Plan: CBC with Differential/Platelet, COMPLETE METABOLIC PANEL WITH GFR, Lipid panel  Routine general medical examination at a health care facility - Plan: CBC with Differential/Platelet, COMPLETE METABOLIC PANEL WITH GFR, Lipid panel  Other osteoporosis without current pathological fracture - Plan: CBC with Differential/Platelet, COMPLETE METABOLIC PANEL WITH GFR, Lipid panel Check CBC CMP and lipid panel.  Recommended a COVID booster and RSV vaccine.  Cancer screening is up-to-date.  The remainder of her physical exam is normal.  She has been getting some blurry vision in her left eye and I recommended an ophthalmology consult.  She will Dr. To schedule this.

## 2022-11-04 LAB — CBC WITH DIFFERENTIAL/PLATELET
Absolute Monocytes: 490 cells/uL (ref 200–950)
Basophils Absolute: 41 cells/uL (ref 0–200)
Basophils Relative: 0.6 %
Eosinophils Absolute: 41 cells/uL (ref 15–500)
Eosinophils Relative: 0.6 %
HCT: 40 % (ref 35.0–45.0)
Hemoglobin: 13.4 g/dL (ref 11.7–15.5)
Lymphs Abs: 1618 cells/uL (ref 850–3900)
MCH: 31.5 pg (ref 27.0–33.0)
MCHC: 33.5 g/dL (ref 32.0–36.0)
MCV: 94.1 fL (ref 80.0–100.0)
MPV: 11.3 fL (ref 7.5–12.5)
Monocytes Relative: 7.2 %
Neutro Abs: 4610 cells/uL (ref 1500–7800)
Neutrophils Relative %: 67.8 %
Platelets: 329 10*3/uL (ref 140–400)
RBC: 4.25 10*6/uL (ref 3.80–5.10)
RDW: 11.8 % (ref 11.0–15.0)
Total Lymphocyte: 23.8 %
WBC: 6.8 10*3/uL (ref 3.8–10.8)

## 2022-11-04 LAB — COMPLETE METABOLIC PANEL WITH GFR
AG Ratio: 1.5 (calc) (ref 1.0–2.5)
ALT: 12 U/L (ref 6–29)
AST: 19 U/L (ref 10–35)
Albumin: 4.6 g/dL (ref 3.6–5.1)
Alkaline phosphatase (APISO): 66 U/L (ref 37–153)
BUN: 11 mg/dL (ref 7–25)
CO2: 28 mmol/L (ref 20–32)
Calcium: 9.8 mg/dL (ref 8.6–10.4)
Chloride: 101 mmol/L (ref 98–110)
Creat: 0.66 mg/dL (ref 0.60–1.00)
Globulin: 3 g/dL (calc) (ref 1.9–3.7)
Glucose, Bld: 97 mg/dL (ref 65–99)
Potassium: 4.9 mmol/L (ref 3.5–5.3)
Sodium: 139 mmol/L (ref 135–146)
Total Bilirubin: 0.8 mg/dL (ref 0.2–1.2)
Total Protein: 7.6 g/dL (ref 6.1–8.1)
eGFR: 93 mL/min/{1.73_m2} (ref >=60)

## 2022-11-04 LAB — LIPID PANEL
Cholesterol: 191 mg/dL (ref <200)
HDL: 69 mg/dL (ref >=50)
LDL Cholesterol (Calc): 99 mg/dL (calc)
Non-HDL Cholesterol (Calc): 122 mg/dL (calc) (ref <130)
Total CHOL/HDL Ratio: 2.8 (calc) (ref <5.0)
Triglycerides: 132 mg/dL (ref <150)

## 2022-11-16 ENCOUNTER — Other Ambulatory Visit: Payer: Self-pay | Admitting: Family Medicine

## 2022-11-18 NOTE — Progress Notes (Signed)
Cardiology Office Note:    Date:  11/28/2022   ID:  [name], DOB 11/07/1950, MRN [id number]  PCP:  Susy Frizzle, MD  Cardiologist:  Shelva Majestic, MD  Electrophysiologist:  None   Referring MD: Susy Frizzle, MD   Chief Complaint: follow-up of palpitations and SVT  History of Present Illness:    [name] is a 72 y.o. female with a history of palpitations with known SVT, hyperlipidemia, GERD, and osteopenia who is followed by Dr. Claiborne Billings and presents today for palpitations and SVT.   Patient was remotely seen by Dr. Tamala Julian for SVT.  She has a remote history of SVT in 1998 at which time she was cardioverted. She did relatively well following this until 05/2022 when she started having intermittent episodes of palpitations that she described as heart racing. Longest episode lasted about 30 minutes. PCP prescribed Toprol-XL and referred her to Cardiology. She was seen by Dr. Claiborne Billings in 07/2022 at which time she reported that she had not started the Toprol-XL yet due to concerns that it may cause her BP to drop. Echo and 2 week Zio monitor were ordered for further evaluation and patient was given Lopressor to take as needed for palpitations. Sleep study was also ordered to rule out sleep apnea. Echo showed LVEF of 65-70% with normal wall motion and mild TR. Monitor showed sinus rhythm with 110 episodes of SVT with the longest episode lasting 10 minutes and 24 seconds with an average heart rate of 132 bpm and the fastest interval lasting 12 beats with a max rate of 231 bpm.  Patient was last seen by Dr. Claiborne Billings in 08/2022 at which time she felt like her palpitations were stress mediated. She was started on Toprol-XL.  Patient presents today for follow-up. She is doing great today. Palpitations have completely resolved with the Toprol-XL. She denies any cardiac symptoms - no chest pain, shortness of breath, orthopnea, PND, lower extremity edema, palpitations,  lightheadedness/dizziness, or syncope. She walks 2-3 miles 5 times per week with no issues. She is tolerating the Toprol-XL well with no side effects.   Of note, patient underwent home sleep study for evaluation of obstructive sleep apnea in 08/2022 due to reports of poor sleep and snoring; however, this reportedly did not record. Therefore, in lab split-night sleep study was ordered. However, she was never called to schedule this and she no longer wants to have a sleep study done. She is unsure if she is still snoring but states she feels like she is sleeping better and states her husband has not complained about her snoring.   Past Medical History:  Diagnosis Date   Hyperlipidemia    Osteopenia     Past Surgical History:  Procedure Laterality Date   ABDOMINAL HYSTERECTOMY     age 61, NO BSO   BREAST BIOPSY Bilateral 1996   benign   BREAST SURGERY     biopsy x 2, benign   BUNIONECTOMY Left    Carpel tunnel     FOOT SURGERY     total joint replacement- left foot after bunionectomy   FOOT SURGERY Right    correct bunionectomy done previously   HAMMER TOE SURGERY     TENNIS ELBOW RELEASE/NIRSCHEL PROCEDURE      Current Medications: Current Meds  Medication Sig   acetaminophen (TYLENOL) 325 MG tablet Take 2 tablets (650 mg total) by mouth every 6 (six) hours as needed for mild pain, moderate pain or fever.  Calcium Carbonate-Vit D-Min (CALCIUM 1200 PO) Take 1,200 mg by mouth.   cholecalciferol (VITAMIN D) 1000 UNITS tablet Take 1,000 Units by mouth daily.   desonide (DESOWEN) 0.05 % cream Apply topically 2 (two) times daily.   gentamicin cream (GARAMYCIN) 0.1 % Apply 1 application topically 2 (two) times daily.   Melatonin 3 MG TABS Take 1 each by mouth at bedtime.   metoprolol succinate (TOPROL-XL) 25 MG 24 hr tablet TAKE 1 TABLET (25 MG TOTAL) BY MOUTH DAILY.   pantoprazole (PROTONIX) 40 MG tablet TAKE 1 TABLET BY MOUTH EVERY DAY   simvastatin (ZOCOR) 10 MG tablet TAKE 1 TABLET  BY MOUTH EVERY DAY   TURMERIC PO Take 1,800 mg by mouth daily.     Allergies:   Amitriptyline, Asa [aspirin], Gabapentin, Ibuprofen, and Latex   Social History   Socioeconomic History   Marital status: Married    Spouse name: Not on file   Number of children: Not on file   Years of education: Not on file   Highest education level: Not on file  Occupational History   Not on file  Tobacco Use   Smoking status: Former    Types: Cigarettes    Quit date: 12/14/1997    Years since quitting: 24.9   Smokeless tobacco: Never  Vaping Use   Vaping Use: Never used  Substance and Sexual Activity   Alcohol use: No   Drug use: No   Sexual activity: Not Currently  Other Topics Concern   Not on file  Social History Narrative   Lives with husband   Caffeine use: Coffee- 3 cups per day      Right handed    Social Determinants of Health   Financial Resource Strain: Low Risk  (10/03/2022)   Overall Financial Resource Strain (CARDIA)    Difficulty of Paying Living Expenses: Not hard at all  Food Insecurity: No Food Insecurity (10/03/2022)   Hunger Vital Sign    Worried About Running Out of Food in the Last Year: Never true    [place] in the Last Year: Never true  Transportation Needs: No Transportation Needs (10/03/2022)   PRAPARE - Hydrologist (Medical): No    Lack of Transportation (Non-Medical): No  Physical Activity: Sufficiently Active (10/03/2022)   Exercise Vital Sign    Days of Exercise per Week: 5 days    Minutes of Exercise per Session: 40 min  Stress: No Stress Concern Present (10/03/2022)   [place]    Feeling of Stress : Not at all  Social Connections: [place] (10/03/2022)   Social Connection and Isolation Panel [NHANES]    Frequency of Communication with Friends and Family: More than three times a week    Frequency of Social Gatherings with Friends and  Family: Three times a week    Attends Religious Services: More than 4 times per year    Active Member of Clubs or Organizations: Yes    Attends Music therapist: More than 4 times per year    Marital Status: Married     Family History: The patient's family history includes Alzheimer's disease in her father; Breast cancer in her mother; Cancer in her mother; Depression in her mother; Early death (age of onset: 2) in her maternal grandfather.  ROS:   Please see the history of present illness.     EKGs/Labs/Other Studies Reviewed:    The following studies were  reviewed:  Echocardiogram 08/09/2022: Impressions: 1. Left ventricular ejection fraction, by estimation, is 65 to 70%. Left  ventricular ejection fraction by 3D volume is 66 %. The left ventricle has  normal function. The left ventricle has no regional wall motion  abnormalities. Left ventricular diastolic   parameters were normal. The average left ventricular global longitudinal  strain is -25.3 %. The global longitudinal strain is normal.   2. Right ventricular systolic function is normal. The right ventricular  size is normal. There is normal pulmonary artery systolic pressure.   3. The mitral valve is grossly normal. No evidence of mitral valve  regurgitation. No evidence of mitral stenosis.   4. The aortic valve is tricuspid. Aortic valve regurgitation is not  visualized. No aortic stenosis is present.   5. The inferior vena cava is normal in size with greater than 50%  respiratory variability, suggesting right atrial pressure of 3 mmHg.   Comparison(s): Unable to review 2003 study for comparison.  _______________  2 Week Zio Monitor 07/31/2022 to 08/14/2022: Patient had a min HR of 53 bpm, max HR of 231 bpm, and avg HR of 76 bpm. Predominant underlying rhythm was Sinus Rhythm. 110 Supraventricular Tachycardia runs occurred, the run with the fastest interval lasting 12 beats with a max rate of 231 bpm, the   longest lasting 10 mins 24 secs with an avg rate of 132 bpm. Some episodes of Supraventricular Tachycardia may be possible Atrial Tachycardia with variable block. Supraventricular Tachycardia was detected within +/- 45 seconds of symptomatic patient  event(s). Isolated SVEs were rare (<1.0%), SVE Couplets were rare (<1.0%), and SVE Triplets were rare (<1.0%). Isolated VEs were rare (<1.0%, 1978), VE Couplets were rare (<1.0%, 6), and VE Triplets were rare (<1.0%, 1).   EKG:  EKG not ordered today.   Recent Labs: 06/26/2022: TSH 2.71 11/03/2022: ALT 12; BUN 11; Creat 0.66; Hemoglobin 13.4; Platelets 329; Potassium 4.9; Sodium 139  Recent Lipid Panel    Component Value Date/Time   CHOL 191 11/03/2022 0906   TRIG 132 11/03/2022 0906   HDL 69 11/03/2022 0906   CHOLHDL 2.8 11/03/2022 0906   VLDL 40 (H) 06/19/2017 1007   LDLCALC 99 11/03/2022 0906    Physical Exam:    Vital Signs: BP 132/80   Pulse 79   Ht 5' 3"$  (1.6 m)   Wt 153 lb 9.6 oz (69.7 kg)   SpO2 92%   BMI 27.21 kg/m     Wt Readings from Last 3 Encounters:  11/28/22 153 lb 9.6 oz (69.7 kg)  11/03/22 153 lb 3.2 oz (69.5 kg)  10/03/22 153 lb (69.4 kg)     General: 72 y.o. Caucasian female in no acute distress. HEENT: Normocephalic and atraumatic. Sclera clear.  Neck: Supple. No carotid bruits. No JVD. Heart: RRR. Distinct S1 and S2. No murmurs, gallops, or rubs. Radial and posterior tibial pulses 2+ and equal bilaterally. Lungs: No increased work of breathing. Clear to ausculation bilaterally. No wheezes, rhonchi, or rales.  Abdomen: Soft, non-distended, and non-tender to palpation.  Extremities: No lower extremity edema.    Skin: Warm and dry. Neuro: Alert and oriented x3. No focal deficits. Psych: Normal affect. Responds appropriately.  Assessment:    1. Palpitations   2. Paroxysmal SVT (supraventricular tachycardia)   3. Hyperlipidemia, unspecified hyperlipidemia type   4. History of snoring     Plan:     Palpitations Paroxysmal SVT Patient has a history of paroxysmal SVT. Required cardioversion for this in 1998 and  then did relatively well for over 20 years. She started having recurrent palpitations in 05/2022. Monitor in 07/2022 showed sinus rhythm with 110 episodes of SVT with the longest episode lasting 10 minutes and 24 seconds with an average heart rate of 132 bpm and the fastest interval lasting 12 beats with a max rate of 231 bpm. - Doing well. Palpitations have completely resolved on beta-blocker.  - Continue Toprol-XL 54m daily.  Hyperlipidemia Lipid panel on 11/03/2022: Total Cholesterol 191, Triglycerides 132, HDL 69, LDL 99.  - Continue Simvastatin 195mdaily.  - Followed by PCP.  History of Snoring Patient underwent home sleep study for evaluation of obstructive sleep apnea  in 08/2022; however, this reportedly did not record. Therefore, in lab split-night sleep study was ordered. However, she was never called to schedule this and she does not want to have a sleep study done. She is unsure if she is still snoring but states she feels like she is sleeping better and states her husband has not complained about her snoring.   Disposition: Follow up in 1 year.   Medication Adjustments/Labs and Tests Ordered: Current medicines are reviewed at length with the patient today.  Concerns regarding medicines are outlined above.  No orders of the defined types were placed in this encounter.  No orders of the defined types were placed in this encounter.   Patient Instructions  Medication Instructions:   Your physician recommends that you continue on your current medications as directed. Please refer to the Current Medication list given to you today.  *If you need a refill on your cardiac medications before your next appointment, please call your pharmacy*  Lab Work: NONE ordered at this time of appointment   If you have labs (blood work) drawn today and your tests are completely  normal, you will receive your results only by: MyBurienif you have MyChart) OR A paper copy in the mail If you have any lab test that is abnormal or we need to change your treatment, we will call you to review the results.  Testing/Procedures: NONE ordered at this time of appointment   Follow-Up: [facility].  As part of our continuing mission to provide you with exceptional heart care, we have created designated Provider Care Teams.  These Care Teams include your primary Cardiologist (physician) and Advanced Practice Providers (APPs -  Physician Assistants and Nurse Practitioners) who all work together to provide you with the care you need, when you need it.  Your next appointment:   1 year(s)  Provider:   ThShelva MajesticMD     Other Instructions    Signed, CaDarreld McleanPA-C  11/28/2022 1:31 PM    [facility]

## 2022-11-21 ENCOUNTER — Other Ambulatory Visit: Payer: Self-pay | Admitting: Family Medicine

## 2022-11-22 NOTE — Telephone Encounter (Signed)
Requested medication (s) are due for refill today: yes  Requested medication (s) are on the active medication list: yes  Last refill:  10/03/22  Future visit scheduled: yes  Notes to clinic:  Unable to refill per protocol, last refill by another provider. Historical provider refill for metoprolol succinate. Request is too soon for protonix, last refill 11/16/21 for 60 days.     Requested Prescriptions  Pending Prescriptions Disp Refills   metoprolol succinate (TOPROL-XL) 25 MG 24 hr tablet [Pharmacy Med Name: METOPROLOL SUCC ER 25 MG TAB] 90 tablet 1    Sig: TAKE 1 TABLET (25 MG TOTAL) BY MOUTH DAILY.     Cardiovascular:  Beta Blockers Failed - 11/21/2022  9:40 AM      Failed - Valid encounter within last 6 months    Recent Outpatient Visits           8 months ago Gastroesophageal reflux disease, unspecified whether esophagitis present   [place] Pickard, Cammie Mcgee, MD   1 year ago Pure hypercholesterolemia   [place] Pickard, Cammie Mcgee, MD   1 year ago Pure hypercholesterolemia   [place] Dennard Schaumann, Cammie Mcgee, MD   2 years ago Routine general medical examination at a health care facility   [place], Cammie Mcgee, MD   2 years ago Acute bacterial rhinosinusitis   [place] Dennard Schaumann, Cammie Mcgee, MD       Future Appointments             In 6 days Darreld Mclean, PA-C [place] at [facility]   In 11 months Pickard, Cammie Mcgee, MD [facility]            Passed - Last BP in normal range    BP Readings from Last 1 Encounters:  11/03/22 126/82         Passed - Last Heart Rate in normal range    Pulse Readings from Last 1 Encounters:  11/03/22 73          pantoprazole (PROTONIX) 40 MG tablet [Pharmacy Med Name: PANTOPRAZOLE SOD DR 40 MG TAB] 60 tablet 0    Sig: TAKE 1 TABLET BY MOUTH EVERY DAY     Gastroenterology: Proton  Pump Inhibitors Passed - 11/21/2022  9:40 AM      Passed - Valid encounter within last 12 months    Recent Outpatient Visits           8 months ago Gastroesophageal reflux disease, unspecified whether esophagitis present   [place] Pickard, Cammie Mcgee, MD   1 year ago Pure hypercholesterolemia   [place] Pickard, Cammie Mcgee, MD   1 year ago Pure hypercholesterolemia   [place] Dennard Schaumann, Cammie Mcgee, MD   2 years ago Routine general medical examination at a health care facility   [place], Warren T, MD   2 years ago Acute bacterial rhinosinusitis   [place] Pickard, Cammie Mcgee, MD       Future Appointments             In 6 days Darreld Mclean, PA-C [place] at [facility]   In 11 months Pickard, Cammie Mcgee, MD [place], [place]

## 2022-11-28 ENCOUNTER — Encounter: Payer: Self-pay | Admitting: Student

## 2022-11-28 ENCOUNTER — Ambulatory Visit: Payer: PPO | Attending: Student | Admitting: Student

## 2022-11-28 VITALS — BP 132/80 | HR 79 | Ht 63.0 in | Wt 153.6 lb

## 2022-11-28 DIAGNOSIS — E785 Hyperlipidemia, unspecified: Secondary | ICD-10-CM | POA: Diagnosis not present

## 2022-11-28 DIAGNOSIS — I471 Supraventricular tachycardia, unspecified: Secondary | ICD-10-CM | POA: Diagnosis not present

## 2022-11-28 DIAGNOSIS — R002 Palpitations: Secondary | ICD-10-CM | POA: Diagnosis not present

## 2022-11-28 DIAGNOSIS — Z87898 Personal history of other specified conditions: Secondary | ICD-10-CM

## 2022-11-28 NOTE — Patient Instructions (Signed)
Medication Instructions:   Your physician recommends that you continue on your current medications as directed. Please refer to the Current Medication list given to you today.  *If you need a refill on your cardiac medications before your next appointment, please call your pharmacy*  Lab Work: NONE ordered at this time of appointment   If you have labs (blood work) drawn today and your tests are completely normal, you will receive your results only by: [place] (if you have MyChart) OR A paper copy in the mail If you have any lab test that is abnormal or we need to change your treatment, we will call you to review the results.  Testing/Procedures: NONE ordered at this time of appointment   Follow-Up: At [facility], you and your health needs are our priority.  As part of our continuing mission to provide you with exceptional heart care, we have created designated Provider Care Teams.  These Care Teams include your primary Cardiologist (physician) and Advanced Practice Providers (APPs -  Physician Assistants and Nurse Practitioners) who all work together to provide you with the care you need, when you need it.  Your next appointment:   1 year(s)  Provider:   Shelva Majestic, MD     Other Instructions

## 2022-12-15 ENCOUNTER — Other Ambulatory Visit: Payer: Self-pay | Admitting: Family Medicine

## 2023-01-25 ENCOUNTER — Other Ambulatory Visit: Payer: Self-pay | Admitting: Family Medicine

## 2023-01-25 NOTE — Telephone Encounter (Signed)
Requested Prescriptions  Pending Prescriptions Disp Refills   pantoprazole (PROTONIX) 40 MG tablet [Pharmacy Med Name: PANTOPRAZOLE SOD DR 40 MG TAB] 90 tablet 2    Sig: TAKE 1 TABLET BY MOUTH EVERY DAY     Gastroenterology: Proton Pump Inhibitors Passed - 01/25/2023  9:35 AM      Passed - Valid encounter within last 12 months    Recent Outpatient Visits           10 months ago Gastroesophageal reflux disease, unspecified whether esophagitis present   [facility] Pickard, Priscille Heidelberg, MD   1 year ago Pure hypercholesterolemia   [facility] Pickard, Priscille Heidelberg, MD   1 year ago Pure hypercholesterolemia   [facility] Medicine Tanya Nones, Priscille Heidelberg, MD   2 years ago Routine general medical examination at a health care facility   [facility] Medicine Donita Brooks, MD   2 years ago Acute bacterial rhinosinusitis   [facility] Pickard, Priscille Heidelberg, MD       Future Appointments             In 9 months Pickard, Priscille Heidelberg, MD [facility], [facility]

## 2023-03-19 DIAGNOSIS — K625 Hemorrhage of anus and rectum: Secondary | ICD-10-CM | POA: Diagnosis not present

## 2023-03-19 DIAGNOSIS — K219 Gastro-esophageal reflux disease without esophagitis: Secondary | ICD-10-CM | POA: Diagnosis not present

## 2023-04-17 DIAGNOSIS — K625 Hemorrhage of anus and rectum: Secondary | ICD-10-CM | POA: Diagnosis not present

## 2023-04-17 DIAGNOSIS — K2930 Chronic superficial gastritis without bleeding: Secondary | ICD-10-CM | POA: Diagnosis not present

## 2023-04-17 DIAGNOSIS — K5730 Diverticulosis of large intestine without perforation or abscess without bleeding: Secondary | ICD-10-CM | POA: Diagnosis not present

## 2023-04-17 DIAGNOSIS — K648 Other hemorrhoids: Secondary | ICD-10-CM | POA: Diagnosis not present

## 2023-04-17 DIAGNOSIS — K219 Gastro-esophageal reflux disease without esophagitis: Secondary | ICD-10-CM | POA: Diagnosis not present

## 2023-04-17 LAB — HM COLONOSCOPY

## 2023-04-24 DIAGNOSIS — K219 Gastro-esophageal reflux disease without esophagitis: Secondary | ICD-10-CM | POA: Diagnosis not present

## 2023-04-24 DIAGNOSIS — K2930 Chronic superficial gastritis without bleeding: Secondary | ICD-10-CM | POA: Diagnosis not present

## 2023-06-08 ENCOUNTER — Other Ambulatory Visit: Payer: Self-pay | Admitting: Family Medicine

## 2023-06-08 NOTE — Telephone Encounter (Signed)
Due to a system glitch the last office visit is not detected for this practice.  LOV 06/26/2022.   Labs in date.  Next OV Jan. 2025.  Requested Prescriptions  Pending Prescriptions Disp Refills   simvastatin (ZOCOR) 10 MG tablet [Pharmacy Med Name: SIMVASTATIN 10 MG TABLET] 90 tablet 0    Sig: TAKE 1 TABLET BY MOUTH EVERY DAY     Cardiovascular:  Antilipid - Statins Failed - 06/08/2023  1:38 AM      Failed - Valid encounter within last 12 months    Recent Outpatient Visits           1 year ago Gastroesophageal reflux disease, unspecified whether esophagitis present   [facility] Pickard, Priscille Heidelberg, MD   1 year ago Pure hypercholesterolemia   [facility] Medicine Pickard, Priscille Heidelberg, MD   1 year ago Pure hypercholesterolemia   [facility] Tanya Nones, Priscille Heidelberg, MD   2 years ago Routine general medical examination at a health care facility   [facility] Pickard, Priscille Heidelberg, MD   3 years ago Acute bacterial rhinosinusitis   [facility] Pickard, Priscille Heidelberg, MD       Future Appointments             In 5 months Pickard, Priscille Heidelberg, MD [facility], [facility]            Failed - Lipid Panel in normal range within the last 12 months    Cholesterol  Date Value Ref Range Status  11/03/2022 191 <200 mg/dL Final   LDL Cholesterol (Calc)  Date Value Ref Range Status  11/03/2022 99 mg/dL (calc) Final    Comment:    Reference range: <100 . Desirable range <100 mg/dL for primary prevention;   <70 mg/dL for patients with CHD or diabetic patients  with > or = 2 CHD risk factors. Marland Kitchen LDL-C is now calculated using the Martin-Hopkins  calculation, which is a validated novel method providing  better accuracy than the Friedewald equation in the  estimation of LDL-C.  Horald Pollen et al. Lenox Ahr. 5409;811(91): 2061-2068  (http://education.[facility].com/faq/FAQ164)    HDL  Date Value Ref Range Status   11/03/2022 69 > OR = 50 mg/dL Final   Triglycerides  Date Value Ref Range Status  11/03/2022 132 <150 mg/dL Final         Passed - Patient is not pregnant       metoprolol succinate (TOPROL-XL) 25 MG 24 hr tablet [Pharmacy Med Name: METOPROLOL SUCC ER 25 MG TAB] 90 tablet 0    Sig: TAKE 1 TABLET (25 MG TOTAL) BY MOUTH DAILY.     Cardiovascular:  Beta Blockers Failed - 06/08/2023  1:38 AM      Failed - Valid encounter within last 6 months    Recent Outpatient Visits           1 year ago Gastroesophageal reflux disease, unspecified whether esophagitis present   [facility] Medicine Pickard, Priscille Heidelberg, MD   1 year ago Pure hypercholesterolemia   [facility] Pickard, Priscille Heidelberg, MD   1 year ago Pure hypercholesterolemia   [facility] Medicine Tanya Nones, Priscille Heidelberg, MD   2 years ago Routine general medical examination at a health care facility   [facility] Medicine Pickard, Priscille Heidelberg, MD   3 years ago Acute bacterial rhinosinusitis   [facility] Pickard, Priscille Heidelberg, MD  Future Appointments             In 5 months Pickard, Priscille Heidelberg, MD [facility], [facility]            Passed - Last BP in normal range    BP Readings from Last 1 Encounters:  11/28/22 132/80         Passed - Last Heart Rate in normal range    Pulse Readings from Last 1 Encounters:  11/28/22 79

## 2023-06-15 DIAGNOSIS — Z1231 Encounter for screening mammogram for malignant neoplasm of breast: Secondary | ICD-10-CM | POA: Diagnosis not present

## 2023-06-15 LAB — HM MAMMOGRAPHY

## 2023-07-06 ENCOUNTER — Encounter: Payer: Self-pay | Admitting: Family Medicine

## 2023-08-17 DIAGNOSIS — H35373 Puckering of macula, bilateral: Secondary | ICD-10-CM | POA: Diagnosis not present

## 2023-09-05 ENCOUNTER — Other Ambulatory Visit: Payer: Self-pay

## 2023-09-05 ENCOUNTER — Telehealth: Payer: Self-pay | Admitting: Family Medicine

## 2023-09-05 DIAGNOSIS — E7800 Pure hypercholesterolemia, unspecified: Secondary | ICD-10-CM

## 2023-09-05 DIAGNOSIS — I471 Supraventricular tachycardia, unspecified: Secondary | ICD-10-CM

## 2023-09-05 MED ORDER — SIMVASTATIN 10 MG PO TABS: 10.0000 mg | ORAL_TABLET | Freq: Every day | ORAL | 1 refills | Status: DC

## 2023-09-05 MED ORDER — METOPROLOL SUCCINATE ER 25 MG PO TB24
25.0000 mg | ORAL_TABLET | Freq: Every day | ORAL | 1 refills | Status: DC
Start: 2023-09-05 — End: 2024-02-28

## 2023-09-05 NOTE — Telephone Encounter (Signed)
Prescription Request  09/05/2023  LOV: 11/03/2022  What is the name of the medication or equipment?   simvastatin (ZOCOR) 10 MG tablet   metoprolol succinate (TOPROL-XL) 25 MG 24 hr tablet [[id number]]  **90 day scripts requested**  Have you contacted your pharmacy to request a refill? Yes   Which pharmacy would you like this sent to?  [id number] [place], [place] - [address] [place] [address] Phone: [phone] Fax: [phone]    Patient notified that their request is being sent to the clinical staff for review and that they should receive a response within 2 business days.   Please advise pharmacist.

## 2023-09-06 ENCOUNTER — Other Ambulatory Visit: Payer: Self-pay | Admitting: Family Medicine

## 2023-09-07 NOTE — Telephone Encounter (Signed)
OV 11/08/22 Requested Prescriptions  Pending Prescriptions Disp Refills   pantoprazole (PROTONIX) 40 MG tablet [Pharmacy Med Name: PANTOPRAZOLE SOD DR 40 MG TAB] 90 tablet 2    Sig: TAKE 1 TABLET BY MOUTH EVERY DAY     Gastroenterology: Proton Pump Inhibitors Failed - 09/06/2023  1:32 AM      Failed - Valid encounter within last 12 months    Recent Outpatient Visits           1 year ago Gastroesophageal reflux disease, unspecified whether esophagitis present   [facility] Medicine Donita Brooks, MD   2 years ago Pure hypercholesterolemia   [facility] Pickard, Priscille Heidelberg, MD   2 years ago Pure hypercholesterolemia   [facility] Medicine Tanya Nones, Priscille Heidelberg, MD   3 years ago Routine general medical examination at a health care facility   [facility] Medicine Donita Brooks, MD   3 years ago Acute bacterial rhinosinusitis   [facility] Medicine Pickard, Priscille Heidelberg, MD       Future Appointments             In 2 months Pickard, Priscille Heidelberg, MD [facility], [facility]

## 2023-10-24 DIAGNOSIS — L821 Other seborrheic keratosis: Secondary | ICD-10-CM | POA: Diagnosis not present

## 2023-10-24 DIAGNOSIS — D1801 Hemangioma of skin and subcutaneous tissue: Secondary | ICD-10-CM | POA: Diagnosis not present

## 2023-10-24 DIAGNOSIS — L570 Actinic keratosis: Secondary | ICD-10-CM | POA: Diagnosis not present

## 2023-10-24 DIAGNOSIS — L720 Epidermal cyst: Secondary | ICD-10-CM | POA: Diagnosis not present

## 2023-10-24 DIAGNOSIS — L814 Other melanin hyperpigmentation: Secondary | ICD-10-CM | POA: Diagnosis not present

## 2023-10-24 DIAGNOSIS — L812 Freckles: Secondary | ICD-10-CM | POA: Diagnosis not present

## 2023-10-24 DIAGNOSIS — Z85828 Personal history of other malignant neoplasm of skin: Secondary | ICD-10-CM | POA: Diagnosis not present

## 2023-11-09 ENCOUNTER — Encounter: Payer: Self-pay | Admitting: Family Medicine

## 2023-11-09 ENCOUNTER — Ambulatory Visit (INDEPENDENT_AMBULATORY_CARE_PROVIDER_SITE_OTHER): Payer: PPO | Admitting: Family Medicine

## 2023-11-09 VITALS — BP 124/82 | HR 75 | Temp 98.2°F | Ht 63.0 in | Wt 154.0 lb

## 2023-11-09 DIAGNOSIS — Z0001 Encounter for general adult medical examination with abnormal findings: Secondary | ICD-10-CM | POA: Diagnosis not present

## 2023-11-09 DIAGNOSIS — E7800 Pure hypercholesterolemia, unspecified: Secondary | ICD-10-CM | POA: Diagnosis not present

## 2023-11-09 DIAGNOSIS — Z23 Encounter for immunization: Secondary | ICD-10-CM | POA: Diagnosis not present

## 2023-11-09 DIAGNOSIS — Z0000 Encounter for general adult medical examination without abnormal findings: Secondary | ICD-10-CM

## 2023-11-09 DIAGNOSIS — M818 Other osteoporosis without current pathological fracture: Secondary | ICD-10-CM

## 2023-11-09 NOTE — Progress Notes (Signed)
Subjective:    Patient ID: [name], female    DOB: 12-25-1950, 73 y.o.   MRN: [id number]  HPI Patient is a very pleasant 73 year old white female who is here today for complete physical exam.  Patient has a history of osteoporosis, but stopped prolia due to suspected growths in the mouth.  Last dexa was 9/23 and was stable,  Due again in 2026. Colonoscopy in 5/24 showed diverticulosis and hemorrhoids.  She does not require Pap smear.  Mammogram was performed in 05/2023.  She denies any depression, falls, or memory loss.  Otherwise the patient has been doing well.  She is due pneumonia shot.  She is also due for tetanus which she defers today.  Immunization History  Administered Date(s) Administered   Fluad Quad(high Dose 65+) 07/31/2019, 08/03/2020, 07/21/2021, 08/15/2022   Influenza, High Dose Seasonal PF 06/14/2016   Influenza,inj,Quad PF,6+ Mos 07/15/2015, 06/19/2017, 07/04/2018   Influenza-Unspecified 07/16/2014   PFIZER(Purple Top)SARS-COV-2 Vaccination 12/15/2019, 01/13/2020   Pneumococcal Conjugate-13 05/29/2016   Pneumococcal Polysaccharide-23 12/15/2014   Tdap 03/16/2014   Zoster Recombinant(Shingrix) 07/17/2018, 09/20/2018     Past Medical History:  Diagnosis Date   Hyperlipidemia    Osteopenia    Past Surgical History:  Procedure Laterality Date   ABDOMINAL HYSTERECTOMY     age 59, NO BSO   BREAST BIOPSY Bilateral 1996   benign   BREAST SURGERY     biopsy x 2, benign   BUNIONECTOMY Left    Carpel tunnel     FOOT SURGERY     total joint replacement- left foot after bunionectomy   FOOT SURGERY Right    correct bunionectomy done previously   HAMMER TOE SURGERY     TENNIS ELBOW RELEASE/NIRSCHEL PROCEDURE     Current Outpatient Medications on File Prior to Visit  Medication Sig Dispense Refill   acetaminophen (TYLENOL) 325 MG tablet Take 2 tablets (650 mg total) by mouth every 6 (six) hours as needed for mild pain, moderate pain or fever. 60 tablet 0    Calcium Carbonate-Vit D-Min (CALCIUM 1200 PO) Take 1,200 mg by mouth.     cholecalciferol (VITAMIN D) 1000 UNITS tablet Take 1,000 Units by mouth daily.     desonide (DESOWEN) 0.05 % cream Apply topically 2 (two) times daily. 30 g 0   gentamicin cream (GARAMYCIN) 0.1 % Apply 1 application topically 2 (two) times daily. 30 g 1   Melatonin 3 MG TABS Take 1 each by mouth at bedtime.     metoprolol succinate (TOPROL-XL) 25 MG 24 hr tablet Take 1 tablet (25 mg total) by mouth daily. 90 tablet 1   pantoprazole (PROTONIX) 40 MG tablet TAKE 1 TABLET BY MOUTH EVERY DAY 90 tablet 0   simvastatin (ZOCOR) 10 MG tablet Take 1 tablet (10 mg total) by mouth daily. 90 tablet 1   TURMERIC PO Take 1,800 mg by mouth daily.     No current facility-administered medications on file prior to visit.   Allergies  Allergen Reactions   Amitriptyline     Very tired, fatigued   Asa [Aspirin] Other (See Comments)    Stomach irritation   Gabapentin     Dizzy, very tired   Ibuprofen Other (See Comments)    Stomach irritation   Latex    Social History   Socioeconomic History   Marital status: Married    Spouse name: Not on file   Number of children: Not on file   Years of education: Not on file  Highest education level: Not on file  Occupational History   Not on file  Tobacco Use   Smoking status: Former    Current packs/day: 0.00    Types: Cigarettes    Quit date: 12/14/1997    Years since quitting: 25.9   Smokeless tobacco: Never  Vaping Use   Vaping status: Never Used  Substance and Sexual Activity   Alcohol use: No   Drug use: No   Sexual activity: Not Currently  Other Topics Concern   Not on file  Social History Narrative   Lives with husband   Caffeine use: Coffee- 3 cups per day      Right handed    Social Drivers of Health   Financial Resource Strain: Low Risk  (10/03/2022)   Overall Financial Resource Strain (CARDIA)    Difficulty of Paying Living Expenses: Not hard at all  Food  Insecurity: No Food Insecurity (10/03/2022)   Hunger Vital Sign    Worried About Running Out of Food in the Last Year: Never true    Ran Out of Food in the Last Year: Never true  Transportation Needs: No Transportation Needs (10/03/2022)   PRAPARE - Administrator, Civil Service (Medical): No    Lack of Transportation (Non-Medical): No  Physical Activity: Sufficiently Active (10/03/2022)   Exercise Vital Sign    Days of Exercise per Week: 5 days    Minutes of Exercise per Session: 40 min  Stress: No Stress Concern Present (10/03/2022)   [employer] of Occupational Health - Occupational Stress Questionnaire    Feeling of Stress : Not at all  Social Connections: Socially Integrated (10/03/2022)   Social Connection and Isolation Panel [NHANES]    Frequency of Communication with Friends and Family: More than three times a week    Frequency of Social Gatherings with Friends and Family: Three times a week    Attends Religious Services: More than 4 times per year    Active Member of Clubs or Organizations: Yes    Attends Banker Meetings: More than 4 times per year    Marital Status: Married  Catering manager Violence: Not At Risk (10/03/2022)   Humiliation, Afraid, Rape, and Kick questionnaire    Fear of Current or Ex-Partner: No    Emotionally Abused: No    Physically Abused: No    Sexually Abused: No   Family History  Problem Relation Age of Onset   Depression Mother    Cancer Mother        breast s/p mastectomy   Breast cancer Mother    Alzheimer's disease Father    Early death Maternal Grandfather 3       Mill Accident     Review of Systems  All other systems reviewed and are negative.      Objective:   Physical Exam Vitals reviewed.  Constitutional:      General: She is not in acute distress.    Appearance: She is well-developed. She is not diaphoretic.  HENT:     Head: Normocephalic and atraumatic.     Right Ear: External ear  normal.     Left Ear: External ear normal.     Nose: Nose normal.     Mouth/Throat:     Pharynx: No oropharyngeal exudate.  Eyes:     General: No scleral icterus.       Right eye: No discharge.        Left eye: No discharge.  Conjunctiva/sclera: Conjunctivae normal.     Pupils: Pupils are equal, round, and reactive to light.  Neck:     Thyroid: No thyromegaly.     Vascular: No JVD.     Trachea: No tracheal deviation.  Cardiovascular:     Rate and Rhythm: Normal rate and regular rhythm.     Heart sounds: Normal heart sounds. No murmur heard.    No friction rub. No gallop.  Pulmonary:     Effort: Pulmonary effort is normal. No respiratory distress.     Breath sounds: Normal breath sounds. No stridor. No wheezing or rales.  Chest:     Chest wall: No tenderness.  Abdominal:     General: Bowel sounds are normal. There is no distension.     Palpations: Abdomen is soft. There is no mass.     Tenderness: There is no abdominal tenderness. There is no guarding or rebound.  Musculoskeletal:        General: No tenderness. Normal range of motion.     Cervical back: Normal range of motion and neck supple.  Lymphadenopathy:     Cervical: No cervical adenopathy.  Skin:    General: Skin is warm.     Coloration: Skin is not pale.     Findings: No erythema or rash.  Neurological:     Mental Status: She is alert and oriented to person, place, and time.     Cranial Nerves: No cranial nerve deficit.     Motor: No abnormal muscle tone.     Coordination: Coordination normal.     Deep Tendon Reflexes: Reflexes are normal and symmetric.  Psychiatric:        Behavior: Behavior normal.        Thought Content: Thought content normal.        Judgment: Judgment normal.           Assessment & Plan:  Routine general medical examination at a health care facility  Pure hypercholesterolemia - Plan: CBC with Differential/Platelet, COMPLETE METABOLIC PANEL WITH GFR, Lipid panel  Other  osteoporosis without current pathological fracture  Need for vaccination - Plan: Pneumococcal Conjugate PCV21(Capvaxive) Patient's physical exam today is completely normal.  She received capvaxive.  We deferred the tetanus shot for now.  I will check a CBC a CMP and a lipid panel.  I would like to keep her LDL cholesterol less than 841.  I recommended we repeat a bone density test in 2026.  If her bone density is deteriorating at that point to the level of osteoporosis we could consider evenity.  Colonoscopy does not need to be repeated.  Mammogram is due in August.

## 2023-11-10 LAB — COMPLETE METABOLIC PANEL WITH GFR
AG Ratio: 1.6 (calc) (ref 1.0–2.5)
ALT: 12 U/L (ref 6–29)
AST: 18 U/L (ref 10–35)
Albumin: 4.6 g/dL (ref 3.6–5.1)
Alkaline phosphatase (APISO): 65 U/L (ref 37–153)
BUN: 12 mg/dL (ref 7–25)
CO2: 28 mmol/L (ref 20–32)
Calcium: 9.9 mg/dL (ref 8.6–10.4)
Chloride: 102 mmol/L (ref 98–110)
Creat: 0.71 mg/dL (ref 0.60–1.00)
Globulin: 2.8 g/dL (ref 1.9–3.7)
Glucose, Bld: 89 mg/dL (ref 65–99)
Potassium: 4.8 mmol/L (ref 3.5–5.3)
Sodium: 140 mmol/L (ref 135–146)
Total Bilirubin: 0.7 mg/dL (ref 0.2–1.2)
Total Protein: 7.4 g/dL (ref 6.1–8.1)
eGFR: 89 mL/min/{1.73_m2} (ref >=60)

## 2023-11-10 LAB — CBC WITH DIFFERENTIAL/PLATELET
Absolute Lymphocytes: 2041 {cells}/uL (ref 850–3900)
Absolute Monocytes: 502 {cells}/uL (ref 200–950)
Basophils Absolute: 71 {cells}/uL (ref 0–200)
Basophils Relative: 1.2 %
Eosinophils Absolute: 201 {cells}/uL (ref 15–500)
Eosinophils Relative: 3.4 %
HCT: 39.3 % (ref 35.0–45.0)
Hemoglobin: 12.9 g/dL (ref 11.7–15.5)
MCH: 31.1 pg (ref 27.0–33.0)
MCHC: 32.8 g/dL (ref 32.0–36.0)
MCV: 94.7 fL (ref 80.0–100.0)
MPV: 11.4 fL (ref 7.5–12.5)
Monocytes Relative: 8.5 %
Neutro Abs: 3086 {cells}/uL (ref 1500–7800)
Neutrophils Relative %: 52.3 %
Platelets: 336 10*3/uL (ref 140–400)
RBC: 4.15 10*6/uL (ref 3.80–5.10)
RDW: 11.6 % (ref 11.0–15.0)
Total Lymphocyte: 34.6 %
WBC: 5.9 10*3/uL (ref 3.8–10.8)

## 2023-11-10 LAB — LIPID PANEL
Cholesterol: 190 mg/dL (ref <200)
HDL: 62 mg/dL (ref >=50)
LDL Cholesterol (Calc): 103 mg/dL — ABNORMAL HIGH
Non-HDL Cholesterol (Calc): 128 mg/dL (ref <130)
Total CHOL/HDL Ratio: 3.1 (calc) (ref <5.0)
Triglycerides: 146 mg/dL (ref <150)

## 2023-11-12 ENCOUNTER — Telehealth: Payer: Self-pay | Admitting: Family Medicine

## 2023-11-12 MED ORDER — PANTOPRAZOLE SODIUM 40 MG PO TBEC: 40.0000 mg | DELAYED_RELEASE_TABLET | Freq: Every day | ORAL | 0 refills | Status: DC

## 2023-11-12 NOTE — Telephone Encounter (Signed)
Prescription Request  11/12/2023  LOV: 11/09/2023  What is the name of the medication or equipment?   pantoprazole (PROTONIX) 40 MG tablet  **90 day script requested**  Have you contacted your pharmacy to request a refill? Yes   Which pharmacy would you like this sent to?  [id number] [place], [place] - [address] [place] [address] Phone: [phone] Fax: [phone]    Patient notified that their request is being sent to the clinical staff for review and that they should receive a response within 2 business days.   Please advise pharmacist.

## 2024-02-27 ENCOUNTER — Telehealth: Payer: Self-pay | Admitting: Family Medicine

## 2024-02-27 ENCOUNTER — Other Ambulatory Visit: Payer: Self-pay | Admitting: Family Medicine

## 2024-02-27 ENCOUNTER — Other Ambulatory Visit: Payer: Self-pay

## 2024-02-27 DIAGNOSIS — I471 Supraventricular tachycardia, unspecified: Secondary | ICD-10-CM

## 2024-02-27 DIAGNOSIS — E7800 Pure hypercholesterolemia, unspecified: Secondary | ICD-10-CM

## 2024-02-27 DIAGNOSIS — K219 Gastro-esophageal reflux disease without esophagitis: Secondary | ICD-10-CM

## 2024-02-27 MED ORDER — PANTOPRAZOLE SODIUM 40 MG PO TBEC: 40.0000 mg | DELAYED_RELEASE_TABLET | Freq: Every day | ORAL | 2 refills | Status: DC

## 2024-02-27 NOTE — Telephone Encounter (Signed)
 Copied from CRM [id number]. Topic: Clinical - Medication Refill >> Feb 27, 2024 10:01 AM Alpha Arts wrote: Medication: pantoprazole  (PROTONIX ) 40 MG tablet  Has the patient contacted their pharmacy? Yes (Agent: If no, request that the patient contact the pharmacy for the refill. If patient does not wish to contact the pharmacy document the reason why and proceed with request.) (Agent: If yes, when and what did the pharmacy advise?)  This is the patient's preferred pharmacy:  [id number] [place], [place] - [address] [place] [address] Phone: [phone] Fax: [phone]  Is this the correct pharmacy for this prescription? Yes If no, delete pharmacy and type the correct one.   Has the prescription been filled recently? Yes  Is the patient out of the medication? Yes  Has the patient been seen for an appointment in the last year OR does the patient have an upcoming appointment? Yes  Can we respond through MyChart? Yes  Agent: Please be advised that Rx refills may take up to 3 business days. We ask that you follow-up with your pharmacy.

## 2024-02-28 NOTE — Telephone Encounter (Signed)
 Requested Prescriptions  Pending Prescriptions Disp Refills   simvastatin  (ZOCOR ) 10 MG tablet [Pharmacy Med Name: SIMVASTATIN  10 MG TABLET] 90 tablet 1    Sig: TAKE 1 TABLET BY MOUTH EVERY DAY     Cardiovascular:  Antilipid - Statins Failed - 02/28/2024  1:26 PM      Failed - Lipid Panel in normal range within the last 12 months    Cholesterol  Date Value Ref Range Status  11/09/2023 190 <200 mg/dL Final   LDL Cholesterol (Calc)  Date Value Ref Range Status  11/09/2023 103 (H) mg/dL (calc) Final    Comment:    Reference range: <100 . Desirable range <100 mg/dL for primary prevention;   <70 mg/dL for patients with CHD or diabetic patients  with > or = 2 CHD risk factors. Aaron Aas LDL-C is now calculated using the Martin-Hopkins  calculation, which is a validated novel method providing  better accuracy than the Friedewald equation in the  estimation of LDL-C.  Melinda Sprawls et al. Erroll Heard. 1610;960(45): 2061-2068  (http://education.[facility].com/faq/FAQ164)    HDL  Date Value Ref Range Status  11/09/2023 62 > OR = 50 mg/dL Final   Triglycerides  Date Value Ref Range Status  11/09/2023 146 <150 mg/dL Final         Passed - Patient is not pregnant      Passed - Valid encounter within last 12 months    Recent Outpatient Visits           3 months ago Routine general medical examination at a health care facility   [facility] Austine Lefort, MD   1 year ago Encounter for Medicare annual wellness exam   [facility] Austine Lefort, MD   1 year ago Latex allergy, contact dermatitis   [facility] Jenelle Mis, FNP   1 year ago Paroxysmal supraventricular tachycardia [facility])   [facility] Pickard, Cisco Crest, MD               metoprolol  succinate (TOPROL -XL) 25 MG 24 hr tablet [Pharmacy Med Name: METOPROLOL  SUCC ER 25 MG TAB] 90 tablet 0    Sig: TAKE 1  TABLET (25 MG TOTAL) BY MOUTH DAILY.     Cardiovascular:  Beta Blockers Passed - 02/28/2024  1:26 PM      Passed - Last BP in normal range    BP Readings from Last 1 Encounters:  11/09/23 124/82         Passed - Last Heart Rate in normal range    Pulse Readings from Last 1 Encounters:  11/09/23 75         Passed - Valid encounter within last 6 months    Recent Outpatient Visits           3 months ago Routine general medical examination at a health care facility   [facility] Austine Lefort, MD   1 year ago Encounter for Medicare annual wellness exam   [facility] Austine Lefort, MD   1 year ago Latex allergy, contact dermatitis   [facility] Jenelle Mis, FNP   1 year ago Paroxysmal supraventricular tachycardia [facility])   [place] Pickard, Cisco Crest, MD

## 2024-03-06 DIAGNOSIS — H9313 Tinnitus, bilateral: Secondary | ICD-10-CM | POA: Diagnosis not present

## 2024-03-06 DIAGNOSIS — H6121 Impacted cerumen, right ear: Secondary | ICD-10-CM | POA: Diagnosis not present

## 2024-04-10 ENCOUNTER — Encounter: Payer: Self-pay | Admitting: Family Medicine

## 2024-04-10 ENCOUNTER — Ambulatory Visit: Admitting: Family Medicine

## 2024-04-10 VITALS — BP 112/60 | HR 69 | Temp 98.5°F | Ht 63.0 in | Wt 151.1 lb

## 2024-04-10 DIAGNOSIS — R42 Dizziness and giddiness: Secondary | ICD-10-CM | POA: Diagnosis not present

## 2024-04-10 MED ORDER — MECLIZINE HCL 25 MG PO TABS: 25.0000 mg | ORAL_TABLET | Freq: Three times a day (TID) | ORAL | 0 refills | Status: AC | PRN

## 2024-04-10 NOTE — Progress Notes (Signed)
 Subjective:    Patient ID: [name], female    DOB: 1951-05-13, 73 y.o.   MRN: [id number]  Beginning yesterday, the patient had vertigo.  She states the room was spinning around her.  She felt extremely nauseated and felt like she had throw up.  She felt hot and lightheaded.  The waves would come and go.  She cannot pinpoint exactly what will trigger it.  However if she sat completely still, the vertigo and room spinning would subside.  She denies any syncope or chest pain or shortness of breath. Past Medical History:  Diagnosis Date   Diverticulosis    Esophagitis    Hyperlipidemia    Osteopenia    Past Surgical History:  Procedure Laterality Date   ABDOMINAL HYSTERECTOMY  37   age 55, NO BSO   BREAST BIOPSY Bilateral 1996   benign   BREAST SURGERY  1996   biopsy x 2, benign   BUNIONECTOMY Left    Carpel tunnel     FOOT SURGERY     total joint replacement- left foot after bunionectomy   FOOT SURGERY Right    correct bunionectomy done previously   HAMMER TOE SURGERY     TENNIS ELBOW RELEASE/NIRSCHEL PROCEDURE     Current Outpatient Medications on File Prior to Visit  Medication Sig Dispense Refill   acetaminophen  (TYLENOL ) 325 MG tablet Take 2 tablets (650 mg total) by mouth every 6 (six) hours as needed for mild pain, moderate pain or fever. 60 tablet 0   Calcium Carbonate-Vit D-Min (CALCIUM 1200 PO) Take 1,200 mg by mouth.     cholecalciferol (VITAMIN D ) 1000 UNITS tablet Take 1,000 Units by mouth daily.     desonide  (DESOWEN ) 0.05 % cream Apply topically 2 (two) times daily. 30 g 0   gentamicin  cream (GARAMYCIN ) 0.1 % Apply 1 application topically 2 (two) times daily. 30 g 1   Melatonin 3 MG TABS Take 1 each by mouth at bedtime.     metoprolol  succinate (TOPROL -XL) 25 MG 24 hr tablet TAKE 1 TABLET (25 MG TOTAL) BY MOUTH DAILY. 90 tablet 0   pantoprazole  (PROTONIX ) 40 MG tablet Take 1 tablet (40 mg total) by mouth daily. 90 tablet 2   simvastatin  (ZOCOR ) 10 MG  tablet TAKE 1 TABLET BY MOUTH EVERY DAY 90 tablet 1   TURMERIC PO Take 1,800 mg by mouth daily.     No current facility-administered medications on file prior to visit.   Allergies  Allergen Reactions   Amitriptyline      Very tired, fatigued   Asa [Aspirin] Other (See Comments)    Stomach irritation   Gabapentin      Dizzy, very tired   Ibuprofen Other (See Comments)    Stomach irritation   Latex    Social History   Socioeconomic History   Marital status: Married    Spouse name: Not on file   Number of children: Not on file   Years of education: Not on file   Highest education level: Not on file  Occupational History   Not on file  Tobacco Use   Smoking status: Former    Current packs/day: 0.00    Types: Cigarettes    Quit date: 12/14/1997    Years since quitting: 26.3   Smokeless tobacco: Never  Vaping Use   Vaping status: Never Used  Substance and Sexual Activity   Alcohol use: No   Drug use: No   Sexual activity: Not Currently  Other Topics Concern  Not on file  Social History Narrative   Lives with husband   Caffeine use: Coffee- 3 cups per day      Right handed    Social Drivers of Health   Financial Resource Strain: Low Risk  (10/03/2022)   Overall Financial Resource Strain (CARDIA)    Difficulty of Paying Living Expenses: Not hard at all  Food Insecurity: No Food Insecurity (10/03/2022)   Hunger Vital Sign    Worried About Running Out of Food in the Last Year: Never true    Ran Out of Food in the Last Year: Never true  Transportation Needs: No Transportation Needs (10/03/2022)   PRAPARE - Administrator, Civil Service (Medical): No    Lack of Transportation (Non-Medical): No  Physical Activity: Sufficiently Active (10/03/2022)   Exercise Vital Sign    Days of Exercise per Week: 5 days    Minutes of Exercise per Session: 40 min  Stress: No Stress Concern Present (10/03/2022)   [employer] of Occupational Health - Occupational  Stress Questionnaire    Feeling of Stress : Not at all  Social Connections: Socially Integrated (10/03/2022)   Social Connection and Isolation Panel    Frequency of Communication with Friends and Family: More than three times a week    Frequency of Social Gatherings with Friends and Family: Three times a week    Attends Religious Services: More than 4 times per year    Active Member of Clubs or Organizations: Yes    Attends Banker Meetings: More than 4 times per year    Marital Status: Married  Catering manager Violence: Not At Risk (10/03/2022)   Humiliation, Afraid, Rape, and Kick questionnaire    Fear of Current or Ex-Partner: No    Emotionally Abused: No    Physically Abused: No    Sexually Abused: No   Family History  Problem Relation Age of Onset   Depression Mother    Cancer Mother        breast s/p mastectomy   Breast cancer Mother    Hypertension Mother    Alzheimer's disease Father    Heart attack Father    Heart disease Father    Early death Maternal Grandfather 10       Mill Accident   Arthritis Sister    Hearing loss Sister      Review of Systems  All other systems reviewed and are negative.      Objective:   Physical Exam Vitals reviewed.  Constitutional:      General: She is not in acute distress.    Appearance: She is well-developed. She is not diaphoretic.  HENT:     Head: Normocephalic and atraumatic.     Right Ear: External ear normal.     Left Ear: External ear normal.     Nose: Nose normal.     Mouth/Throat:     Pharynx: No oropharyngeal exudate.   Eyes:     General: No scleral icterus.       Right eye: No discharge.        Left eye: No discharge.     Conjunctiva/sclera: Conjunctivae normal.     Pupils: Pupils are equal, round, and reactive to light.   Neck:     Thyroid : No thyromegaly.     Vascular: No JVD.     Trachea: No tracheal deviation.   Cardiovascular:     Rate and Rhythm: Normal rate and regular rhythm.  Heart sounds: Normal heart sounds. No murmur heard.    No friction rub. No gallop.  Pulmonary:     Effort: Pulmonary effort is normal. No respiratory distress.     Breath sounds: Normal breath sounds. No stridor. No wheezing or rales.  Chest:     Chest wall: No tenderness.  Abdominal:     General: Bowel sounds are normal. There is no distension.     Palpations: Abdomen is soft. There is no mass.     Tenderness: There is no abdominal tenderness. There is no guarding or rebound.   Musculoskeletal:        General: No tenderness. Normal range of motion.     Cervical back: Normal range of motion and neck supple.  Lymphadenopathy:     Cervical: No cervical adenopathy.   Skin:    General: Skin is warm.     Coloration: Skin is not pale.     Findings: No erythema or rash.   Neurological:     Mental Status: She is alert and oriented to person, place, and time.     Cranial Nerves: No cranial nerve deficit.     Motor: No abnormal muscle tone.     Coordination: Coordination normal.     Deep Tendon Reflexes: Reflexes are normal and symmetric.   Psychiatric:        Behavior: Behavior normal.        Thought Content: Thought content normal.        Judgment: Judgment normal.    She has a Dix-Hallpike maneuver positive to the right.  I am unable to reproduce the vertigo when the patient sits up.       Assessment & Plan:  Vertigo Presentation and exam are consistent with BPPV.  Use meclizine 25 mg every 8 hours as needed for vertigo.  We discussed the natural history of this.  Recommended precautions regarding driving.  Anticipate gradual self-limited resolution over the next week.

## 2024-05-08 DIAGNOSIS — Z85828 Personal history of other malignant neoplasm of skin: Secondary | ICD-10-CM | POA: Diagnosis not present

## 2024-05-08 DIAGNOSIS — D692 Other nonthrombocytopenic purpura: Secondary | ICD-10-CM | POA: Diagnosis not present

## 2024-05-08 DIAGNOSIS — L821 Other seborrheic keratosis: Secondary | ICD-10-CM | POA: Diagnosis not present

## 2024-05-08 DIAGNOSIS — L570 Actinic keratosis: Secondary | ICD-10-CM | POA: Diagnosis not present

## 2024-05-26 ENCOUNTER — Telehealth: Payer: Self-pay | Admitting: Family Medicine

## 2024-05-26 ENCOUNTER — Other Ambulatory Visit: Payer: Self-pay

## 2024-05-26 DIAGNOSIS — I471 Supraventricular tachycardia, unspecified: Secondary | ICD-10-CM

## 2024-05-26 MED ORDER — METOPROLOL SUCCINATE ER 25 MG PO TB24: 25.0000 mg | ORAL_TABLET | Freq: Every day | ORAL | 0 refills | Status: DC

## 2024-05-26 NOTE — Telephone Encounter (Signed)
 Sent in medication

## 2024-05-26 NOTE — Telephone Encounter (Signed)
 Prescription Request  05/26/2024  LOV: 04/10/2024  What is the name of the medication or equipment?   metoprolol  succinate (TOPROL -XL) 25 MG 24 hr tablet  **90 day script requested**  Have you contacted your pharmacy to request a refill? Yes   Which pharmacy would you like this sent to?  [id number] [address], [place] - [address] [facility] [address] [address] [place] [address] Phone: [phone] Fax: [phone]    Patient notified that their request is being sent to the clinical staff for review and that they should receive a response within 2 business days.   Please advise pharmacist.

## 2024-06-20 DIAGNOSIS — Z1231 Encounter for screening mammogram for malignant neoplasm of breast: Secondary | ICD-10-CM | POA: Diagnosis not present

## 2024-06-20 LAB — HM MAMMOGRAPHY

## 2024-06-23 ENCOUNTER — Encounter: Payer: Self-pay | Admitting: Family Medicine

## 2024-07-15 ENCOUNTER — Encounter: Payer: Self-pay | Admitting: Cardiovascular Disease

## 2024-07-15 ENCOUNTER — Ambulatory Visit: Attending: Cardiovascular Disease | Admitting: Cardiovascular Disease

## 2024-07-15 VITALS — BP 130/90 | HR 69 | Ht 63.0 in | Wt 154.0 lb

## 2024-07-15 DIAGNOSIS — E7800 Pure hypercholesterolemia, unspecified: Secondary | ICD-10-CM | POA: Diagnosis not present

## 2024-07-15 DIAGNOSIS — I471 Supraventricular tachycardia, unspecified: Secondary | ICD-10-CM | POA: Diagnosis not present

## 2024-07-15 DIAGNOSIS — E785 Hyperlipidemia, unspecified: Secondary | ICD-10-CM

## 2024-07-15 NOTE — Patient Instructions (Signed)

## 2024-07-16 ENCOUNTER — Telehealth: Payer: Self-pay

## 2024-07-16 ENCOUNTER — Ambulatory Visit: Payer: Self-pay

## 2024-07-16 NOTE — Telephone Encounter (Signed)
 FYI Only or Action Required?: Action required by provider: clinical question for provider and update on patient condition.  Patient was last seen in primary care on 04/10/2024 by Duanne Butler DASEN, MD.  Called Nurse Triage reporting Breast Pain.  Symptoms began about a month ago.  Interventions attempted: OTC medications: Ibuprofen.  Symptoms are: gradually worsening.  Triage Disposition: See PCP Within 2 Weeks  Patient/caregiver understands and will follow disposition?: Yes FYI: Pain x 1 month, getting worse and tender to touch over the past week. No nipple discharge, redness, or dimpling. Pt also denies fever.  Pt had mammo on 9/6, negative results. Pt now wanting a diagnostic ultrasound. Appt scheduled with PCP to discuss more.   Copied from CRM [id number]. Topic: Clinical - Red Word Triage >> Jul 16, 2024  8:36 AM Antwanette L wrote: Red Word that prompted transfer to Nurse Triage: On 9/6 patient had her annual mammogram done and no signs of cancer or abnormalities but the patient is still having pain in her left breast >> Jul 16, 2024  8:38 AM Antwanette L wrote: Pt is also having sob Reason for Disposition  [1] Breast pain AND [2] cause is not known  Answer Assessment - Initial Assessment Questions 1. SYMPTOM: What's the main symptom you're concerned about?  (e.g., lump, nipple discharge, pain, rash)     Pain in left breast, no discharge from the nipple, but has a lump on the side of the breast for using.   2. LOCATION: Where is the pain located?     Left pain  3. ONSET: When did breast pain  start?     Ongoing for the past month, getting worse this past to the point where it takes her breath away  4. PRIOR HISTORY: Do you have any history of prior problems with your breasts? (e.g., breast cancer, breast implant, fibrocystic breast disease)     Has had a lump on her breast for years. Has had biopsies  5. CAUSE: What do you think is causing this symptom?     Unsure of  cause, but has reasonable concern for breast cancer due to family hister  6. OTHER SYMPTOMS: Do you have any other symptoms? (e.g., breast pain, fever, nipple discharge, redness or rash)     Tender to touch  7. PREGNANCY-BREASTFEEDING: Is there any chance you are pregnant? When was your last menstrual period? Are you breastfeeding?     .  Protocols used: Breast Symptoms-A-AH

## 2024-07-16 NOTE — Telephone Encounter (Unsigned)
 Copied from CRM #[id number]. Topic: Clinical - Request for Lab/Test Order >> Jul 16, 2024  8:33 AM Antwanette L wrote: Reason for CRM: On 9/6, patient underwent her annual mammogram and she is still having pain in the left breast. Imaging showed no signs of cancer or abnormalities. Cardiologist confirmed her heart is functioning well. Patient contacted [facility] and was advised that an ultrasound is needed. [facility] requires an order from Dr. Duanne to proceed.

## 2024-07-17 ENCOUNTER — Ambulatory Visit (INDEPENDENT_AMBULATORY_CARE_PROVIDER_SITE_OTHER): Admitting: Family Medicine

## 2024-07-17 ENCOUNTER — Encounter: Payer: Self-pay | Admitting: Family Medicine

## 2024-07-17 ENCOUNTER — Other Ambulatory Visit: Payer: Self-pay | Admitting: Family Medicine

## 2024-07-17 VITALS — BP 118/62 | HR 64 | Temp 98.4°F | Ht 63.0 in | Wt 154.8 lb

## 2024-07-17 DIAGNOSIS — N644 Mastodynia: Secondary | ICD-10-CM

## 2024-07-17 NOTE — Progress Notes (Signed)
 Subjective:    Patient ID: [name], female    DOB: [birth year], 73 y.o.   MRN: [id number]  1 month ago, the patient had a mammogram.  She states that the mammogram hurts severely in her left breast when it was performed.  However since that time she complains of pain in her left breast.  Her left breast is tender to palpation at approximately 2:00.  It is very sore in that area.  There is no visible erythema.  There is no warmth.  There is no induration.  There is no access.  There is no palpable mass. Past Medical History:  Diagnosis Date   Diverticulosis    Esophagitis    Hyperlipidemia    Osteopenia    Past Surgical History:  Procedure Laterality Date   ABDOMINAL HYSTERECTOMY  65   age 35, NO BSO   BREAST BIOPSY Bilateral 1996   benign   BREAST SURGERY  1996   biopsy x 2, benign   BUNIONECTOMY Left    Carpel tunnel     FOOT SURGERY     total joint replacement- left foot after bunionectomy   FOOT SURGERY Right    correct bunionectomy done previously   HAMMER TOE SURGERY     TENNIS ELBOW RELEASE/NIRSCHEL PROCEDURE     Current Outpatient Medications on File Prior to Visit  Medication Sig Dispense Refill   acetaminophen  (TYLENOL ) 325 MG tablet Take 2 tablets (650 mg total) by mouth every 6 (six) hours as needed for mild pain, moderate pain or fever. 60 tablet 0   Calcium Carbonate-Vit D-Min (CALCIUM 1200 PO) Take 1,200 mg by mouth.     cholecalciferol (VITAMIN D ) 1000 UNITS tablet Take 1,000 Units by mouth daily.     desonide  (DESOWEN ) 0.05 % cream Apply topically 2 (two) times daily. 30 g 0   gentamicin  cream (GARAMYCIN ) 0.1 % Apply 1 application topically 2 (two) times daily. 30 g 1   meclizine  (ANTIVERT ) 25 MG tablet Take 1 tablet (25 mg total) by mouth 3 (three) times daily as needed. 30 tablet 0   Melatonin 3 MG TABS Take 1 each by mouth at bedtime.     metoprolol  succinate (TOPROL -XL) 25 MG 24 hr tablet Take 1 tablet (25 mg total) by mouth daily. 90 tablet 0    pantoprazole  (PROTONIX ) 40 MG tablet Take 1 tablet (40 mg total) by mouth daily. 90 tablet 2   simvastatin  (ZOCOR ) 10 MG tablet TAKE 1 TABLET BY MOUTH EVERY DAY 90 tablet 1   TURMERIC PO Take 1,800 mg by mouth daily.     No current facility-administered medications on file prior to visit.   Allergies  Allergen Reactions   Amitriptyline      Very tired, fatigued   Asa [Aspirin] Other (See Comments)    Stomach irritation   Gabapentin      Dizzy, very tired   Ibuprofen Other (See Comments)    Stomach irritation   Latex    Social History   Socioeconomic History   Marital status: Married    Spouse name: Not on file   Number of children: Not on file   Years of education: Not on file   Highest education level: Not on file  Occupational History   Not on file  Tobacco Use   Smoking status: Former    Current packs/day: 0.00    Types: Cigarettes    Quit date: 12/14/1997    Years since quitting: 26.6   Smokeless tobacco: Never  Vaping  Use   Vaping status: Never Used  Substance and Sexual Activity   Alcohol use: No   Drug use: No   Sexual activity: Not Currently  Other Topics Concern   Not on file  Social History Narrative   Lives with husband   Caffeine use: Coffee- 3 cups per day      Right handed    Social Drivers of Health   Financial Resource Strain: Low Risk  (10/03/2022)   Overall Financial Resource Strain (CARDIA)    Difficulty of Paying Living Expenses: Not hard at all  Food Insecurity: No Food Insecurity (10/03/2022)   Hunger Vital Sign    Worried About Running Out of Food in the Last Year: Never true    Ran Out of Food in the Last Year: Never true  Transportation Needs: No Transportation Needs (10/03/2022)   PRAPARE - Administrator, Civil Service (Medical): No    Lack of Transportation (Non-Medical): No  Physical Activity: Sufficiently Active (10/03/2022)   Exercise Vital Sign    Days of Exercise per Week: 5 days    Minutes of Exercise per  Session: 40 min  Stress: No Stress Concern Present (10/03/2022)   [employer] of Occupational Health - Occupational Stress Questionnaire    Feeling of Stress : Not at all  Social Connections: Socially Integrated (10/03/2022)   Social Connection and Isolation Panel    Frequency of Communication with Friends and Family: More than three times a week    Frequency of Social Gatherings with Friends and Family: Three times a week    Attends Religious Services: More than 4 times per year    Active Member of Clubs or Organizations: Yes    Attends Banker Meetings: More than 4 times per year    Marital Status: Married  Catering manager Violence: Not At Risk (10/03/2022)   Humiliation, Afraid, Rape, and Kick questionnaire    Fear of Current or Ex-Partner: No    Emotionally Abused: No    Physically Abused: No    Sexually Abused: No   Family History  Problem Relation Age of Onset   Depression Mother    Cancer Mother        breast s/p mastectomy   Breast cancer Mother    Hypertension Mother    Alzheimer's disease Father    Heart attack Father    Heart disease Father    Early death Maternal Grandfather 103       Mill Accident   Arthritis Sister    Hearing loss Sister      Review of Systems  All other systems reviewed and are negative.      Objective:   Physical Exam Vitals reviewed.  Constitutional:      General: She is not in acute distress.    Appearance: She is well-developed. She is not diaphoretic.  HENT:     Head: Normocephalic and atraumatic.     Mouth/Throat:     Pharynx: No oropharyngeal exudate.  Eyes:     General: No scleral icterus.       Right eye: No discharge.        Left eye: No discharge.     Conjunctiva/sclera: Conjunctivae normal.     Pupils: Pupils are equal, round, and reactive to light.  Neck:     Thyroid : No thyromegaly.     Vascular: No JVD.     Trachea: No tracheal deviation.  Cardiovascular:     Rate and Rhythm: Normal rate  and regular rhythm.     Heart sounds: Normal heart sounds. No murmur heard.    No friction rub. No gallop.  Pulmonary:     Effort: Pulmonary effort is normal. No respiratory distress.     Breath sounds: Normal breath sounds. No stridor. No wheezing or rales.  Chest:     Chest wall: No tenderness.  Breasts:    Left: No swelling, bleeding, mass, nipple discharge or skin change.    Abdominal:     General: Bowel sounds are normal. There is no distension.     Palpations: Abdomen is soft. There is no mass.     Tenderness: There is no abdominal tenderness. There is no guarding or rebound.  Musculoskeletal:        General: No tenderness. Normal range of motion.     Cervical back: Normal range of motion and neck supple.  Lymphadenopathy:     Cervical: No cervical adenopathy.  Skin:    General: Skin is warm.     Coloration: Skin is not pale.     Findings: No erythema or rash.  Neurological:     Mental Status: She is alert and oriented to person, place, and time.     Cranial Nerves: No cranial nerve deficit.     Motor: No abnormal muscle tone.     Coordination: Coordination normal.     Deep Tendon Reflexes: Reflexes are normal and symmetric.  Psychiatric:        Behavior: Behavior normal.        Thought Content: Thought content normal.        Judgment: Judgment normal.        Assessment & Plan:  Mastalgia - Plan: US  BREAST COMPLETE UNI LEFT INC AXILLA I believe the patient has mastalgia related to her mammogram.  I believe that she suffered a bruise or contusion during the process that is causing the soreness and pain.  Patient would like an ultrasound for peace of mind.  I am glad to order that

## 2024-07-17 NOTE — Telephone Encounter (Signed)
 Pt. Called . No  contact made. Lvm w/ information that she would be called soon for scheduling. And gave office number for if she has any questions or concerns.

## 2024-07-22 NOTE — Progress Notes (Signed)
 Cardiology Office Note   Date:  07/22/2024  ID:  [name], [name] 01-08-1951, MRN [id number] PCP: Duanne Butler DASEN, MD  [facility] Providers Cardiologist:  Jerel Balding, MD     History of Present Illness [name] is a 73 y.o. female with a history of supraventricular tachycardia, hyperlipidemia, GERD, transitioning cardiology care from Dr. Charlena Sor (remotely seen by Dr. Esmeralda Sharps).  Overall she has been doing very well.  She has not been bothered by palpitations in a long time.  She walks 1 mile a day, 6 days a week.  She has no issues with shortness of breath or chest pain when she exercises.  She had terrible problems with dizziness lasting for a couple of days in June.  It sounds like she had true vertigo (the room was spinning, she had difficulty standing up, had accompanying nausea even with those without vomiting).  She did not have loss of consciousness.  She has not had lower extremity edema, orthopnea, PND, other focal neurological complaints or intermittent claudication.  She initially presented with SVT and 1998 when she actually had to be cardioverted.  Previous echocardiogram shows no evidence of structural heart disease while previous monitoring shows frequent but very brief episodes of SVT lasting for around 10 minutes maximum.  There seemed to be an association with emotional distress and the episodes of SVT.  Her palpitations are very well-controlled on a low-dose of metoprolol  succinate.  Metabolic control is fair 11/09/2023 labs showed cholesterol 190, HDL 62, LDL 103, triglycerides 146.  She has normal renal function.  She does not have diabetes mellitus.  Studies Reviewed EKG Interpretation Date/Time:  Tuesday July 15 2024 16:25:20 EDT Ventricular Rate:  69 PR Interval:  152 QRS Duration:  86 QT Interval:  388 QTC Calculation: 415 R Axis:   19  Text Interpretation: Normal sinus rhythm Normal ECG When compared with ECG of 02-Feb-2016  12:13, Questionable change in QRS axis Confirmed by Leyna Vanderkolk [id number]) on 07/15/2024 4:51:54 PM     Risk Assessment/Calculations        Physical Exam VS:  BP 130/89   Pulse 69   Ht 5' 3 (1.6 m)   Wt 154 lb (69.9 kg)   BMI 27.28 kg/m        Wt Readings from Last 3 Encounters:  07/17/24 154 lb 12.8 oz (70.2 kg)  07/15/24 154 lb (69.9 kg)  04/10/24 151 lb 2 oz (68.5 kg)    GEN: Well nourished, well developed in no acute distress NECK: No JVD; No carotid bruits CARDIAC: RRR, no murmurs, rubs, gallops RESPIRATORY:  Clear to auscultation without rales, wheezing or rhonchi  ABDOMEN: Soft, non-tender, non-distended EXTREMITIES:  No edema; No deformity   ASSESSMENT AND PLAN SVT: Well-controlled on a low-dose of metoprolol .  On my review of her previous monitor from 2023 most of the episodes are extremely brief and nonsustained and the mechanism appears to be ectopic atrial tachycardia.  No clear evidence of a reentry tachycardia.  She does not have any meaningful underlying structural heart disease.  Continue metoprolol . HLP: She does not have any known underlying CAD or PVD.  Most recent LDL cholesterol borderline at 103, but I do not see a compelling reason to change her lipid-lowering therapy.  Continue simvastatin  10 mg daily.  Make sure to take it in the evenings or at bedtime. History of snoring: Really does not have a lot of hypersomnolence during the day.  Was supposed to have a  split-night study, but this has not been performed.  Not eager to revisit the issue at this time. Vertigo: The episode in June sounds convincingly like benign positional vertigo.  It improved with meclizine .  We briefly talked about Epley maneuvers.  Currently asymptomatic.       Dispo: Follow-up in 1 year  Signed, Jerel Balding, MD

## 2024-07-31 ENCOUNTER — Telehealth: Payer: Self-pay

## 2024-07-31 ENCOUNTER — Telehealth: Payer: Self-pay | Admitting: Family Medicine

## 2024-07-31 NOTE — Telephone Encounter (Signed)
 Copied from CRM #[id number]. Topic: Clinical - Request for Lab/Test Order >> Jul 31, 2024 12:04 PM Willma R wrote: Reason for CRM: Patient is requesting the order for the US  BREAST COMPLETE UNI LEFT INC AXILLA be sent to [facility].  Fax: [phone]  Patient can be reached at [phone]

## 2024-08-14 DIAGNOSIS — N644 Mastodynia: Secondary | ICD-10-CM | POA: Diagnosis not present

## 2024-08-14 DIAGNOSIS — R928 Other abnormal and inconclusive findings on diagnostic imaging of breast: Secondary | ICD-10-CM | POA: Diagnosis not present

## 2024-08-14 LAB — HM MAMMOGRAPHY

## 2024-08-18 ENCOUNTER — Encounter: Payer: Self-pay | Admitting: Family Medicine

## 2024-08-20 ENCOUNTER — Telehealth: Payer: Self-pay

## 2024-08-20 ENCOUNTER — Other Ambulatory Visit: Payer: Self-pay

## 2024-08-20 DIAGNOSIS — E7800 Pure hypercholesterolemia, unspecified: Secondary | ICD-10-CM

## 2024-08-20 MED ORDER — SIMVASTATIN 10 MG PO TABS: 10.0000 mg | ORAL_TABLET | Freq: Every day | ORAL | 1 refills | Status: AC

## 2024-08-20 NOTE — Telephone Encounter (Signed)
 Sent in medication

## 2024-08-20 NOTE — Telephone Encounter (Signed)
 Prescription Request  08/20/2024  LOV: 07/17/24  What is the name of the medication or equipment? simvastatin  (ZOCOR ) 10 MG tablet [[id number]]   Have you contacted your pharmacy to request a refill? Yes   Which pharmacy would you like this sent to?  [id number] [address], [place] - [address] [facility] [address] [place] [address] Phone: [phone] Fax: [phone]    Patient notified that their request is being sent to the clinical staff for review and that they should receive a response within 2 business days.   Please advise at [facility] [phone]

## 2024-08-22 ENCOUNTER — Other Ambulatory Visit: Payer: Self-pay | Admitting: Family Medicine

## 2024-08-22 DIAGNOSIS — I471 Supraventricular tachycardia, unspecified: Secondary | ICD-10-CM

## 2024-08-22 NOTE — Telephone Encounter (Signed)
 Requested Prescriptions  Pending Prescriptions Disp Refills   metoprolol  succinate (TOPROL -XL) 25 MG 24 hr tablet [Pharmacy Med Name: METOPROLOL  SUCC ER 25 MG TAB] 90 tablet 0    Sig: TAKE 1 TABLET (25 MG TOTAL) BY MOUTH DAILY.     Cardiovascular:  Beta Blockers Failed - 08/22/2024  5:42 PM      Failed - Valid encounter within last 6 months    Recent Outpatient Visits           1 month ago Mastalgia   [facility] Pickard, Butler DASEN, MD   4 months ago Vertigo   [place] [facility] Duanne, Butler DASEN, MD   9 months ago Routine general medical examination at a health care facility   [facility] Duanne Butler DASEN, MD   1 year ago Encounter for Medicare annual wellness exam   [place] [facility] Duanne Butler DASEN, MD   2 years ago Latex allergy, contact dermatitis   [place] [facility] Kayla Jeoffrey RAMAN, FNP              Passed - Last BP in normal range    BP Readings from Last 1 Encounters:  07/17/24 118/62         Passed - Last Heart Rate in normal range    Pulse Readings from Last 1 Encounters:  07/17/24 64

## 2024-11-10 ENCOUNTER — Encounter: Payer: PPO | Admitting: Family Medicine

## 2024-11-15 ENCOUNTER — Other Ambulatory Visit: Payer: Self-pay | Admitting: Family Medicine

## 2024-11-15 DIAGNOSIS — K219 Gastro-esophageal reflux disease without esophagitis: Secondary | ICD-10-CM

## 2024-11-17 ENCOUNTER — Other Ambulatory Visit: Payer: Self-pay

## 2024-11-17 ENCOUNTER — Telehealth: Payer: Self-pay

## 2024-11-17 DIAGNOSIS — I471 Supraventricular tachycardia, unspecified: Secondary | ICD-10-CM

## 2024-11-17 MED ORDER — METOPROLOL SUCCINATE ER 25 MG PO TB24: 25.0000 mg | ORAL_TABLET | Freq: Every day | ORAL | 0 refills | Status: AC

## 2024-11-17 NOTE — Telephone Encounter (Signed)
 Sent in medication

## 2024-12-23 ENCOUNTER — Encounter: Admitting: Family Medicine
